# Patient Record
Sex: Female | Born: 1965 | Race: White | Hispanic: No | Marital: Single | Smoking: Never smoker
Health system: Southern US, Community
[De-identification: ages and names within clinical notes are randomized; demographics above are authoritative.]

## PROBLEM LIST (undated history)

## (undated) ENCOUNTER — Ambulatory Visit: Admission: EM | Payer: Commercial Managed Care - HMO

## (undated) DIAGNOSIS — R112 Nausea with vomiting, unspecified: Secondary | ICD-10-CM

## (undated) DIAGNOSIS — M549 Dorsalgia, unspecified: Secondary | ICD-10-CM

## (undated) DIAGNOSIS — G43909 Migraine, unspecified, not intractable, without status migrainosus: Secondary | ICD-10-CM

## (undated) DIAGNOSIS — I341 Nonrheumatic mitral (valve) prolapse: Secondary | ICD-10-CM

## (undated) DIAGNOSIS — F329 Major depressive disorder, single episode, unspecified: Secondary | ICD-10-CM

## (undated) DIAGNOSIS — K219 Gastro-esophageal reflux disease without esophagitis: Secondary | ICD-10-CM

## (undated) DIAGNOSIS — M26609 Unspecified temporomandibular joint disorder, unspecified side: Secondary | ICD-10-CM

## (undated) DIAGNOSIS — R011 Cardiac murmur, unspecified: Secondary | ICD-10-CM

## (undated) DIAGNOSIS — G4730 Sleep apnea, unspecified: Secondary | ICD-10-CM

## (undated) DIAGNOSIS — B009 Herpesviral infection, unspecified: Secondary | ICD-10-CM

## (undated) DIAGNOSIS — F32A Depression, unspecified: Secondary | ICD-10-CM

## (undated) DIAGNOSIS — T7840XA Allergy, unspecified, initial encounter: Secondary | ICD-10-CM

## (undated) DIAGNOSIS — I10 Essential (primary) hypertension: Secondary | ICD-10-CM

## (undated) DIAGNOSIS — M1990 Unspecified osteoarthritis, unspecified site: Secondary | ICD-10-CM

## (undated) DIAGNOSIS — Z9889 Other specified postprocedural states: Secondary | ICD-10-CM

## (undated) HISTORY — PX: SPINE SURGERY: SHX786

## (undated) HISTORY — DX: Unspecified osteoarthritis, unspecified site: M19.90

## (undated) HISTORY — DX: Migraine, unspecified, not intractable, without status migrainosus: G43.909

## (undated) HISTORY — DX: Herpesviral infection, unspecified: B00.9

## (undated) HISTORY — DX: Major depressive disorder, single episode, unspecified: F32.9

## (undated) HISTORY — PX: ABDOMINAL HYSTERECTOMY: SHX81

## (undated) HISTORY — DX: Essential (primary) hypertension: I10

## (undated) HISTORY — DX: Depression, unspecified: F32.A

## (undated) HISTORY — DX: Allergy, unspecified, initial encounter: T78.40XA

## (undated) HISTORY — PX: SHOULDER SURGERY: SHX246

## (undated) HISTORY — PX: CYSTECTOMY: SUR359

## (undated) HISTORY — DX: Nonrheumatic mitral (valve) prolapse: I34.1

## (undated) HISTORY — PX: JOINT REPLACEMENT: SHX530

## (undated) HISTORY — DX: Cardiac murmur, unspecified: R01.1

## (undated) HISTORY — PX: TMJ ARTHROPLASTY: SHX1066

---

## 1998-03-20 ENCOUNTER — Other Ambulatory Visit: Admission: RE | Admit: 1998-03-20 | Discharge: 1998-03-20 | Payer: Self-pay | Admitting: *Deleted

## 1998-06-12 ENCOUNTER — Inpatient Hospital Stay (HOSPITAL_COMMUNITY): Admission: RE | Admit: 1998-06-12 | Discharge: 1998-06-18 | Payer: Self-pay | Admitting: Neurosurgery

## 1998-07-14 ENCOUNTER — Ambulatory Visit (HOSPITAL_COMMUNITY): Admission: RE | Admit: 1998-07-14 | Discharge: 1998-07-14 | Payer: Self-pay | Admitting: Neurosurgery

## 1999-07-09 ENCOUNTER — Other Ambulatory Visit: Admission: RE | Admit: 1999-07-09 | Discharge: 1999-07-09 | Payer: Self-pay | Admitting: *Deleted

## 2000-04-27 ENCOUNTER — Encounter (INDEPENDENT_AMBULATORY_CARE_PROVIDER_SITE_OTHER): Payer: Self-pay | Admitting: *Deleted

## 2000-04-27 ENCOUNTER — Ambulatory Visit (HOSPITAL_COMMUNITY): Admission: RE | Admit: 2000-04-27 | Discharge: 2000-04-27 | Payer: Self-pay | Admitting: Gastroenterology

## 2000-05-13 ENCOUNTER — Other Ambulatory Visit: Admission: RE | Admit: 2000-05-13 | Discharge: 2000-05-13 | Payer: Self-pay | Admitting: *Deleted

## 2000-05-13 ENCOUNTER — Encounter (INDEPENDENT_AMBULATORY_CARE_PROVIDER_SITE_OTHER): Payer: Self-pay

## 2000-08-02 ENCOUNTER — Encounter (INDEPENDENT_AMBULATORY_CARE_PROVIDER_SITE_OTHER): Payer: Self-pay | Admitting: Specialist

## 2000-08-02 ENCOUNTER — Other Ambulatory Visit: Admission: RE | Admit: 2000-08-02 | Discharge: 2000-08-02 | Payer: Self-pay | Admitting: *Deleted

## 2001-06-07 ENCOUNTER — Other Ambulatory Visit: Admission: RE | Admit: 2001-06-07 | Discharge: 2001-06-07 | Payer: Self-pay | Admitting: *Deleted

## 2003-01-22 ENCOUNTER — Other Ambulatory Visit: Admission: RE | Admit: 2003-01-22 | Discharge: 2003-01-22 | Payer: Self-pay | Admitting: *Deleted

## 2004-07-07 ENCOUNTER — Emergency Department (HOSPITAL_COMMUNITY): Admission: EM | Admit: 2004-07-07 | Discharge: 2004-07-07 | Payer: Self-pay | Admitting: Emergency Medicine

## 2005-05-03 ENCOUNTER — Ambulatory Visit (HOSPITAL_COMMUNITY): Admission: RE | Admit: 2005-05-03 | Discharge: 2005-05-03 | Payer: Self-pay | Admitting: Gastroenterology

## 2005-09-16 ENCOUNTER — Encounter: Admission: RE | Admit: 2005-09-16 | Discharge: 2005-09-16 | Payer: Self-pay | Admitting: Gastroenterology

## 2005-09-28 ENCOUNTER — Other Ambulatory Visit: Admission: RE | Admit: 2005-09-28 | Discharge: 2005-09-28 | Payer: Self-pay | Admitting: *Deleted

## 2007-02-13 ENCOUNTER — Other Ambulatory Visit: Admission: RE | Admit: 2007-02-13 | Discharge: 2007-02-13 | Payer: Self-pay | Admitting: *Deleted

## 2007-06-18 ENCOUNTER — Encounter: Admission: RE | Admit: 2007-06-18 | Discharge: 2007-06-18 | Payer: Self-pay | Admitting: Orthopedic Surgery

## 2007-07-19 ENCOUNTER — Inpatient Hospital Stay (HOSPITAL_COMMUNITY): Admission: RE | Admit: 2007-07-19 | Discharge: 2007-07-24 | Payer: Self-pay | Admitting: Orthopedic Surgery

## 2007-10-23 ENCOUNTER — Inpatient Hospital Stay (HOSPITAL_COMMUNITY): Admission: RE | Admit: 2007-10-23 | Discharge: 2007-10-27 | Payer: Self-pay | Admitting: Orthopedic Surgery

## 2008-02-05 ENCOUNTER — Emergency Department (HOSPITAL_COMMUNITY): Admission: EM | Admit: 2008-02-05 | Discharge: 2008-02-06 | Payer: Self-pay | Admitting: Emergency Medicine

## 2008-03-28 ENCOUNTER — Emergency Department (HOSPITAL_COMMUNITY): Admission: EM | Admit: 2008-03-28 | Discharge: 2008-03-28 | Payer: Self-pay | Admitting: Emergency Medicine

## 2008-07-03 ENCOUNTER — Emergency Department (HOSPITAL_COMMUNITY): Admission: EM | Admit: 2008-07-03 | Discharge: 2008-07-04 | Payer: Self-pay | Admitting: Emergency Medicine

## 2009-07-08 ENCOUNTER — Ambulatory Visit (HOSPITAL_COMMUNITY): Admission: RE | Admit: 2009-07-08 | Discharge: 2009-07-08 | Payer: Self-pay | Admitting: Internal Medicine

## 2009-08-31 ENCOUNTER — Emergency Department (HOSPITAL_COMMUNITY): Admission: EM | Admit: 2009-08-31 | Discharge: 2009-08-31 | Payer: Self-pay | Admitting: Emergency Medicine

## 2009-09-02 ENCOUNTER — Emergency Department (HOSPITAL_COMMUNITY): Admission: EM | Admit: 2009-09-02 | Discharge: 2009-09-02 | Payer: Self-pay | Admitting: Emergency Medicine

## 2010-09-04 ENCOUNTER — Ambulatory Visit (HOSPITAL_COMMUNITY): Admission: RE | Admit: 2010-09-04 | Discharge: 2010-09-04 | Payer: Self-pay | Admitting: Internal Medicine

## 2010-10-10 ENCOUNTER — Emergency Department (HOSPITAL_COMMUNITY): Admission: EM | Admit: 2010-10-10 | Discharge: 2010-10-10 | Payer: Self-pay | Admitting: Emergency Medicine

## 2011-01-03 ENCOUNTER — Encounter: Payer: Self-pay | Admitting: Internal Medicine

## 2011-04-03 ENCOUNTER — Inpatient Hospital Stay (HOSPITAL_COMMUNITY)
Admission: AD | Admit: 2011-04-03 | Discharge: 2011-04-04 | Disposition: A | Discharge status: Home / Self Care | Payer: Self-pay | Source: Ambulatory Visit | Attending: Obstetrics & Gynecology | Admitting: Obstetrics & Gynecology

## 2011-04-03 DIAGNOSIS — N760 Acute vaginitis: Secondary | ICD-10-CM | POA: Insufficient documentation

## 2011-04-03 DIAGNOSIS — N949 Unspecified condition associated with female genital organs and menstrual cycle: Secondary | ICD-10-CM

## 2011-04-03 DIAGNOSIS — A499 Bacterial infection, unspecified: Secondary | ICD-10-CM | POA: Insufficient documentation

## 2011-04-03 DIAGNOSIS — B9689 Other specified bacterial agents as the cause of diseases classified elsewhere: Secondary | ICD-10-CM | POA: Insufficient documentation

## 2011-04-03 LAB — URINALYSIS, ROUTINE W REFLEX MICROSCOPIC
Bilirubin Urine: NEGATIVE
Glucose, UA: NEGATIVE mg/dL
Hgb urine dipstick: NEGATIVE
Ketones, ur: NEGATIVE mg/dL
Nitrite: NEGATIVE
Protein, ur: NEGATIVE mg/dL
Specific Gravity, Urine: 1.025 (ref 1.005–1.030)
Urobilinogen, UA: 0.2 mg/dL (ref 0.0–1.0)
pH: 6 (ref 5.0–8.0)

## 2011-04-04 LAB — WET PREP, GENITAL
Trich, Wet Prep: NONE SEEN
Yeast Wet Prep HPF POC: NONE SEEN

## 2011-04-07 ENCOUNTER — Inpatient Hospital Stay (HOSPITAL_COMMUNITY)
Admission: AD | Admit: 2011-04-07 | Discharge: 2011-04-07 | Disposition: A | Discharge status: Home / Self Care | Payer: Self-pay | Source: Ambulatory Visit | Attending: Obstetrics & Gynecology | Admitting: Obstetrics & Gynecology

## 2011-04-07 DIAGNOSIS — N949 Unspecified condition associated with female genital organs and menstrual cycle: Secondary | ICD-10-CM

## 2011-04-07 DIAGNOSIS — B373 Candidiasis of vulva and vagina: Secondary | ICD-10-CM

## 2011-04-07 DIAGNOSIS — B3731 Acute candidiasis of vulva and vagina: Secondary | ICD-10-CM | POA: Insufficient documentation

## 2011-04-09 LAB — HERPES SIMPLEX VIRUS CULTURE: Culture: DETECTED

## 2011-04-27 NOTE — Op Note (Signed)
NAME:  Carolyn Allen, Carolyn Allen            ACCOUNT NO.:  [id number]   MEDICAL RECORD NO.:  [id number]          PATIENT TYPE:  INP   LOCATION:  2550                         FACILITY:  [facility]   PHYSICIAN:  Feliberto Gottron. Turner Daniels, M.D.   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   PROCEDURE:  Left total knee arthroplasty using DePuy Sigma RP  components, #4 femur, 4 tibia, 10-mm Sigma RP spacer, 38-mm patellar  button, double batch of DePuy HV cement with 1500 mg Zinacef.  All  components cemented.   SURGEON:  Feliberto Gottron. Turner Daniels, M.D.   FIRST ASSISTANT:  Skip Mayer, PA-C.   ANESTHESIA:  General endotracheal.   ESTIMATED BLOOD LOSS:  Minimal.   FLUID REPLACEMENT:  1500 mL crystalloid.   DRAINS PLACED:  Foley catheter.  Two medium Hemovacs.   URINE OUTPUT:  300 mL.   TOURNIQUET TIME:  1 hour and 30 minutes.   INDICATIONS FOR PROCEDURE:  The patient is a 45 year old woman who had  bilateral distal femur AVN of the knees with severe unremitting pain.  She  underwent a successful right total hip arthroplasty back in June  and desires same on the left.  The risks and benefits of surgery are  well-known to the patient, and she is prepared for surgical intervention  on her left knee.   DESCRIPTION OF PROCEDURE:  The patient identified by armband and taken  to the operating room at [facility] after femoral nerve block  anesthesia was induced in the block area.  Appropriate anesthetic  monitors were attached and general endotracheal anesthesia induced.  Tourniquet applied high to the left thigh.  Foley catheter was  inserted.  She received a gram of Ancef preoperatively.  Lateral post and foot  positioner applied to the table and the left lower extremity prepped and  draped in the usual sterile fashion from the ankle to the tourniquet.  Limb wrapped with an Esmarch bandage, tourniquet inflated to 350 mmHg.   We began the procedure by making the anterior midline incision 18 cm in  length starting one handbreadth above the patella going over the patella  and 1 cm medial to and 3 cm distal to the tibial tubercle, cutting  through the skin and subcutaneous tissue.  Small bleeders were  identified and cauterized.  The retinaculum over the patella was incised  and reflected medially, allowing medial parapatellar arthrotomy.  The  patella was everted.  Prepatellar fat pad was resected. Superficial  medial collateral ligament was elevated from anterior to posterior off  the proximal tibia and left intact distally, peeling it off down about 4-  5 cm down the metaphyseal flare of the tibia.  The patella was everted.  The knee was hyperflexed. The cruciate ligaments were  resected with the  electrocautery as were the anterior one-half of menisci.  A  posteromedial Z retractor was placed, a McHale through the notch, and a  lateral Homan retractor.  This exposed the proximal tibia which was  entered with the DePuy step drill followed by an intramedullary rod, and  a 0-degree proximal tibial cutting guide was pinned into place allowing  removal of 8-9 mm of bone medially and laterally using the power saw and  protecting the posterior structures with the retractors.  We then  entered the distal femur 2 mm anterior to the PCL origin followed by the  IM rod and the 5 degree left distal femoral cutting guide set for 11 mm.  It was pinned in place along the epicondylar axis, and the distal 11 mm  of the femur were then resected.  We sized for #4 femoral cutting jig  which was pinned in place in neutral rotation,  allowing anterior,  posterior, and chamfer cuts to be made followed by the box cut.  The  patella was measured out at 22 mm.  The cutting guide was set at 14, and  the posterior 8-9 mm of the patella resected and sized for a 38-mm  patellar button and the lugs were drilled.  At this point, the knee was  once again hyperflexed.  The proximal tibia was exposed.  We sized for a  #4 tibial baseplate which was pinned into place followed by the conical  reamer and the Delta fit keel punch with the bullet tip.  The #4 left  trial femoral component was then hammered on the femur.  The lugs were  drilled.  A 10-mm spacer trial was placed on top of the tibial trial  baseplate, and the knee was reduced with the patellar trial.  The knee  came to full extension, flexed to 130 degrees.  The ligaments were  stable, and the trial components were then removed.  All bony surfaces  were water picked clean and dried with suction and sponges.  A double  batch of DePuy HV cement with 1500 mg Zinacef was mixed at the back  table and applied to all bony and metallic mating surfaces, except for  the posterior condyles of the femur itself.  In order, we hammered into  place a #4 tibial baseplate, a 4 left femoral component, a 10-mm Sigma  RP spacer, and a 38-mm patellar button was squeezed into place.  Excess  cement was removed.  The knee was held in full extension as the cement  cured.  The wound was once again water picked clean.  A medium Hemovac  drain was placed deep in the wound.  The parapatellar arthrotomy was  closed with running #1 Vicryl suture, the subcutaneous tissue with 0 and  2-0 undyed Vicryl suture, and the skin with skin staples.  A dressing of  Xeroform, 4 x 4 dressings, sponges, Webril, and Ace wrap applied.  Tourniquet let down.   The patient awakened and taken to the recovery room without difficulty.      Feliberto Gottron. Turner Daniels, M.D.  Electronically Signed     FJR/MEDQ  D:  10/23/2007  T:   10/23/2007  Job:  [id number]

## 2011-04-27 NOTE — Op Note (Signed)
NAME:  Carolyn Allen, Carolyn Allen            ACCOUNT NO.:  [id number]   MEDICAL RECORD NO.:  [id number]          PATIENT TYPE:  INP   LOCATION:  5040                         FACILITY:  [facility]   PHYSICIAN:  Feliberto Gottron. Turner Daniels, M.D.   DATE OF BIRTH:  [birth year]   DATE OF PROCEDURE:  07/19/2007  DATE OF DISCHARGE:                               OPERATIVE REPORT   PREOPERATIVE DIAGNOSIS:  Avascular necrosis of the right distal femur  and proximal tibia.   POSTOPERATIVE DIAGNOSIS:  Avascular necrosis of the right distal femur  and proximal tibia.   PROCEDURE:  Right total knee arthroplasty using DePuy Sigma RP  components, all cemented, 3 right femur, 4 tibial baseplate, 10-mm Sigma  RP bearing and a 35-mm patellar button.   SURGEON:  Feliberto Gottron.  Turner Daniels, MD   FIRST ASSISTANT:  Skip Mayer, PA-C.   ANESTHETIC:  General endotracheal.   ESTIMATED BLOOD LOSS:  Minimal.   FLUID REPLACEMENT:  1500 mL of crystalloid.   DRAINS PLACED:  Foley catheter and 2 medium Hemovac.   TOURNIQUET TIME:  One hour and 40 minutes.   URINE OUTPUT:  300 mL.   INDICATIONS FOR PROCEDURE:  [age]-year-old woman with MRI-proven  avascular necrosis of the lateral and medial femoral condyles as well as  the proximal tibia of both knees.  She has severe unremitting pain and  has failed conservative treatment and desires elective right total knee  arthroplasty.  Risks and benefits of surgery were discussed with the  patient.   DESCRIPTION OF PROCEDURE:  The patient was identified by armband and  taken the operating room at [facility], where the appropriate  anesthetic monitors were attached and general endotracheal anesthesia  induced with the patient in a supine position.  She received a gram of  Ancef preoperatively.  A Foley catheter was inserted and a tourniquet  applied high to the right thigh.  A foot positioner and lateral post  were applied to the table and the right lower extremity prepped and  draped in  the usual sterile fashion from the ankle to the midthigh.  The  limb was wrapped with an Esmarch bandage and tourniquet inflated to 350  mmHg.  We began the procedure by making an anterior midline incision  starting one handbreadth above the patella, going over the patella and  distal to the patella to 1 cm below the tibial tubercle and about a  centimeter medial to the tibial tubercle.  Small bleeders in the skin  and subcutaneous tissue were identified and cauterized.  The transverse  retinaculum was incised in line with the skin incision and reflected  medially, allowing a medial parapatellar arthrotomy.  The patella was  everted and the prepatellar fat pad resected.  Superficial medial  collateral ligament was then elevated off the medial tibial plateau,  going from anterior to posterior and leaving it attached distally to  loosen the medial collateral ligament and allow external rotation of the  tibia.  The knee was then placed in a hyperflexed position.  The  anterior one-half of both menisci were resected.  The cruciate ligaments  were  resected, as well as some small notch osteophytes.  A posterior  medial Z retractor was then placed on the tibia, a McHale retractor  through the notch and a lateral Homan retractor.  The proximal tibia was  then entered with a DePuy step drill followed by an intramedullary rod  and a 0-degree posterior slope cutting guide, allowing resection of 8-9  mm medially and laterally from the proximal tibia after the cutting  guide had been pinned into place and the rod removed.  The distal femur  was entered with the intramedullary guide set for 5 degrees right.  The  cutting guide set to 11 mm was pinned into place along the epicondylar  axis and the distal 11 mm of the femur were resected with a power  oscillating saw.  We sized for a #3 right femoral component.  The  cutting guide was pinned into place in neutral rotation.  Anterior,  posterior and  chamfer cuts were accomplished without difficulty,  followed by the DePuy box cut.  The everted patella was then measured at  24 mm and a cutting guide set at 15 mm and the posterior aspect of the  patella resected and sized for a 35 patellar button and drilled.  We  then hyperflexed the knee once again and sized the tibia to a #4 tibial  baseplate; this was pinned into place.  The smokestack was then hammered  onto the plate and the conical reaming accomplished, followed by the  Delta fit keel cut with the bullet tip on the trial.  We then placed a  trial 3 right femur.  A 10-mm Sigma RP trial spacer was then placed and  the knee reduced and taken through a range of motion, noted to come to  full extension and flexed to 130 degrees.  The trial patellar button was  noted to track without difficulty.  Trial components were then removed.  All bony surfaces were water-picked clean, dried with suction and  sponges and a double batch of DePuy HV cement was mixed with 1500 mg of  Zinacef and applied to all bony metallic mating surfaces, except for the  posterior condyles of the femur.  In order, we hammered into place a #4  tibial baseplate and removed excess cement, a 3 right femoral component  and removed excess cement and the 35-mm patellar button and removed  excess cement and was squeezed into place.  After the cement had cured,  the knee was once again reduced with a 10-mm Sigma RP spacer and noted  to go from 0-130 degrees of flexion without difficulty.  Collateral  ligaments were noted to be stable.  Medium Hemovac drains were then  placed in the wound.  The wound was once again water-picked clean,  parapatellar arthrotomy closed with running #1 Vicryl suture, the  subcutaneous tissue with 0 and 2-0 undyed Vicryl suture and the skin  with skin staples.  A dressing of Xeroform, 4 x 4 dressing sponges,  Webril and Ace wrap were applied.  Tourniquet was let down.  The patient  was awakened  and taken to the recovery room without difficulty.      Feliberto Gottron. Turner Daniels, M.D.  Electronically Signed     FJR/MEDQ  D:  07/19/2007  T:  07/20/2007  Job:  [id number]

## 2011-04-30 NOTE — Discharge Summary (Signed)
NAME:  LUVERNA, DEGENHART            ACCOUNT NO.:  [id number]   MEDICAL RECORD NO.:  [id number]          PATIENT TYPE:  INP   LOCATION:  5016                         FACILITY:  [facility]   PHYSICIAN:  Feliberto Gottron. Turner Daniels, M.D.   DATE OF BIRTH:  Aug 06, 1966   DATE OF ADMISSION:  10/23/2007  DATE OF DISCHARGE:  10/27/2007                               DISCHARGE SUMMARY   PRIMARY DIAGNOSIS:  End-stage degenerative joint disease of the left  knee.   PROCEDURE:  Left total knee arthroplasty.   HISTORY OF PRESENT ILLNESS:  The patient is a 45 year old woman who had  bilateral distal femur AVn  and underwent successful right total knee  arthroplasty back in June and desires the same on the left.  The risks  and benefits of surgery were explained to the patient and she was  prepped for the surgery.   ALLERGIES:  CODEINE.   PAST MEDICAL HISTORY:  1. Mitral valve prolapse.  2. Degenerative joint disease.  3. Migraines.  4. Temporomandibular joint.   PAST SURGICAL HISTORY:  1. Back fusion.  2. Hysterectomy.  3. Shoulder.  4. __________ .   SOCIAL HISTORY:  No tobacco, ethanol, or IV drug abuse.  She is  divorced.  She is a [employer] doctor.   FAMILY HISTORY:  Her father died at age 18 with a history of cancer and  myocardial infarction.  Her mother died at age 11 of cancer.   REVIEW OF SYSTEMS:  Positive for glasses, jaw pain, shortness of breath  with exertion, hemorrhoids.   PHYSICAL EXAMINATION:  VITAL SIGNS:  Temperature 98, pulse 71,  respirations 16, blood pressure 117/87.  The patient is 5 feet, 7 inches  and weighs 95 pounds.  HEENT:  She has poor dentition of most teeth causing her jaw pain.  HEART:  Has a slight murmur.  EXTREMITIES:  Otherwise normal examination except for her knees which  showed bilateral range of motion at 0-110 degrees with pain, although  the right knee is no longer painful.   PREOPERATIVE LABORATORIES:  Revealed a CBC, comprehensive metabolic  panel, chest  x-ray, PT, and PTT were within normal limits.   HOSPITAL COURSE:  The patient was taken to the [facility] on the day  of surgery where she underwent a left total knee arthroplasty using a  DePuy single RP components, #4 femur, #4 tibia, 10 mm cemented RP  spacer, 38 mm patellar button, DePuy HV cement with 1,500 mg of Omnicef  embedded.  A medium Hemovac was placed to drain.  A Foley was placed  preoperatively.  Postoperatively, the patient was placed on a Dilaudid  patient-controlled analgesic pump for pain control and postoperative  antibiotics.   On postoperative day 1, the patient was awake, alert, in pain 8/10  controlled with the patient-controlled analgesic.  Her wound was clean  and dry.  She was otherwise stable.  Hemoglobin 11.5.  PT 12.7.  She  continued weightbearing as tolerated with physical therapy.  The patient-  controlled analgesic pump was discharged.   On postoperative day 2, the patient continued to make steady progress.  Hemoglobin  9.3.  Vital signs were stable.  INR 1.5.  The dressing was  dry.  Incentive spirometer was encouraged because of some shortness of  breath.  Otherwise, she remained stable.  Physical therapy was  continued.   On postoperative day 3, the patient continued to make slow, but steady  progress without complaints.  She was afebrile.  She had not yet met her  physical therapy goals.   On postoperative day 4, the patient was without complaints.  She was  afebrile.  Her wound was clean and dry.  She was discharged home in the  care of her family.  She will have home physical therapy, home  registered nurse, and Coumadin per [facility].  She will return  to the clinic with Dr. Turner Daniels in 1 weeks time, call in for an  appointment.  Weightbearing as tolerated.  Activity with a walker.  Diet  is regular.  Dressing changes as needed.   MEDICATIONS:  Home medical record sheet is assigned and she also will be  given a prescription for  Coumadin and Tylox.      Laural Benes. [name].      Feliberto Gottron. Turner Daniels, M.D.  Electronically Signed    JBR/MEDQ  D:  01/04/2008  T:  01/05/2008  Job:  [id number]

## 2011-04-30 NOTE — Op Note (Signed)
NAME:  Carolyn Allen, Carolyn Allen            ACCOUNT NO.:  [id number]   MEDICAL RECORD NO.:  [id number]          PATIENT TYPE:  AMB   LOCATION:  ENDO                         FACILITY:  [facility]   PHYSICIAN:  Anselmo Rod, M.D.  DATE OF BIRTH:  [birth year]   DATE OF PROCEDURE:  DATE OF DISCHARGE:                                 OPERATIVE REPORT   PROCEDURE PERFORMED:  Screening colonoscopy.   ENDOSCOPIST:  Charna Elizabeth, M.D.   INSTRUMENT USED:  Olympus video colonoscope.   INDICATIONS FOR PROCEDURE:  A 45 year old white female with a family history  of colon cancer in her mother. Undergoing screening colonoscopy to rule out  colonic polyps, masses, etc.   PREPROCEDURE PREPARATION:  Informed consent was procured from the patient.  The patient was fasted for eight hours prior to the procedure and prepped  with a bottle of magnesium citrate and a gallon of GoLYTELY the night prior  to the procedure.  The risks and benefits of the procedure including a 10%  miss rate for colon polyps or cancers was discussed with the patient as  well.   PREPROCEDURE PHYSICAL:  The patient had stable vital signs.  Neck supple.  Chest clear to auscultation.  S1 and S2 regular.  Abdomen soft with normal  bowel sounds.   DESCRIPTION OF PROCEDURE:  The patient was placed in left lateral decubitus  position and sedated with 100 mg of Demerol and 10 mg of Versed in slow  incremental doses.  Once the patient was adequately sedated and maintained  on low flow oxygen and continuous cardiac monitoring, the Olympus video  colonoscope was advanced from the rectum to the cecum.  The appendicular  orifice and ileocecal valve were visualized and photographed.  The terminal  ileum appeared healthy and without lesion.  Retroflexion in the rectum  revealed small internal hemorrhoids. There was some residual stool in the  colon and multiple washes were done.  Small lesions could have been missed.  The patient tolerated the  procedure well without complication.   IMPRESSION:  1. Normal colonoscopy to the terminal ileum except for small internal      hemorrhoids, no evidence of masses, polyps of diverticulosis.  2. Some residual stool in the colon.  Small lesions could be missed.     RECOMMENDATIONS:  1. Continue high fiber diet with liberal fluid intake.  2. Repeat colonoscopy in the next five years unless the patient develops      any abnormal symptoms in the interim.  3. Outpatient followup as need arises in the future.        JNM/MEDQ  D:  05/03/2005  T:  05/03/2005  Job:  [id number]   cc:   Harrel Lemon. Merla Riches, M.D.  [address]  [place]  [place] [address]  Fax: [phone]   Almedia Balls. Fore, M.D.  [phone] [address] [place]  [place] [address]  Fax: [phone]

## 2011-04-30 NOTE — Discharge Summary (Signed)
NAME:  Carolyn Allen, Carolyn Allen            ACCOUNT NO.:  [id number]   MEDICAL RECORD NO.:  [id number]          PATIENT TYPE:  INP   LOCATION:  5040                         FACILITY:  [facility]   PHYSICIAN:  Feliberto Gottron. Turner Daniels, M.D.   DATE OF BIRTH:  Mar 06, 1966   DATE OF ADMISSION:  07/19/2007  DATE OF DISCHARGE:  07/24/2007                               DISCHARGE SUMMARY   DIAGNOSIS:  Right knee end-stage degenerative joint disease.   PROCEDURE WHILE IN HOSPITAL:  Right total knee arthroplasty.   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  She has severe unrelenting pain and has  failed conservative treatment.  She desires elective right total knee  arthroplasty.  The risks and benefits of the surgery were discussed with  the patient and she wished to proceed with the surgical intervention.   ALLERGIES:  CODEINE.   MEDICATIONS AT TIME OF ADMISSION:  1. Darvocet.  2. Alprazolam.  3. Belladonna Phenobarbital.  4. Paroxetine.  5. Flexeril.  6. Meloxicam.  7. Lisinopril.  8. Vitamin C.  9. Calcium.  10.Multivitamin.  11.Phenergan.  12.Imitrex.  13.Stool softener as needed.   PAST MEDICAL HISTORY:  Usual childhood diseases.   ADULT HISTORY:  1. Mitral valve prolapse.  2. DJD.  3. Migraines.  4. TMJ.   PAST SURGICAL HISTORY:  1. Knee scopes.  2. Back cages.  3. Hysterectomy.  4. Shoulder surgery.   SOCIAL HISTORY:  No tobacco.  No ethanol.  No IV drug abuse.  She is  divorced.  She works as a Midwife.   FAMILY HISTORY:  Father died at age 85 with a history of cancer and MI.  The mother died at age 76 due to cancer.   REVIEW OF SYSTEMS:  HEENT:  Positive for glasses, jaw pain secondary to  her TMJ.  LUNGS:  Shortness of breath with exertion.  Hemorids.  No  recent illness or chest pain.   PHYSICAL EXAMINATION:  VITAL SIGNS:  Temperature 98.4,  pulse 71,  respirations 16, blood pressure 117/87.  She is a 5 feet 7 inches, 195-  pound female.  HEAD:  Normocephalic, atraumatic.  EARS:  TMs are clear.  EYES:  Pupils are equal and reactive to light and accommodation.  MOUTH:  Significant for poor dentition.  No loose teeth.  NOSE:  Clear.  THROAT:  Benign.  NECK:  Full range of motion but positive jaw pain.  CHEST:  Clear to auscultation and percussion.  HEART:  Slight murmur, otherwise regular rate and rhythm.  ABDOMEN:  Soft, nontender.  EXTREMITIES:  Bilateral knee range of motion 0 to 110 degrees with  positive pain, positive foot tap with pain at the knee, stable  ligamentously.  SKIN:  Intact with no abnormalities noted.   X-RAYS:  MRI of both knees shows a femoral AVN, intraoperative picture  is  positive for grade 4 condyle malacia change as well.   PREOPERATIVE LABS:  Include CBC, CMP, chest x-ray, PT, PCT are all  within normal limits with the exception of an albumin of 2.9 and an AST  of 46.   HOSPITAL COURSE:  On the day of admission, the patient was taken to the  operating room at [facility] where she underwent right total  knee arthroplasty using Sigma DePuy RP components, all cemented, #3  right femur, #4 tibia.  A 10-mm Sigma RP bearing and a 35-mm patellar  button, a cemented medium Hemovac was placed alarming to the knee.  Foley catheter was placed perioperatively.  She was placed on  postoperative Coumadin prophylaxis per pharmacy protocol as well as  bridging Lovenox therapy.  She was placed on a PCA Dilaudid pain pump  for her pain control.  Physical therapy was begun in the PACU using a  CPM.  On postoperative day #1, the patient was awake and alert, pain  5/10.  Urine output had increased after a bolus.  Vital signs were  stable.  Blood pressure at 106/73.  PT 14.3.  Hemoglobin 10.0.  WBC  17.1.  She was afebrile and was clean and dry tolerating CPM well.  The  drain was discontinued as it  had become quiescent.  She was otherwise  stable and had been volume depleted but had an increase after bolus.  She was otherwise improving steadily while on her orthopedic pathway.   On postoperative day #2, the patient was complaining of moderate pain.  She was afebrile.  Hemoglobin 9.2.  WBC 12.8.  INR 1.5.  The dressing  was dry.  She was otherwise orthopedically stable.  PCA was  discontinued.  The Foley was discontinued.   On postoperative day #3, the patient was continuing to improve.  She is  afebrile.  Hemoglobin 9.4.  WBC 13.4.  INR 1.9.  The incision was clean  and dry.  Otherwise, neurovascularly intact.  No signs or symptoms  significant for post blood loss anemia.  Discharge planning for the next  day was continued.   On postoperative day #4, the patient was improving, still had not met  her physical therapy goals.  Afebrile.  Chest:  Clear to auscultation.  The wound was clean and dry.  INR was 2.3., otherwise stable and hopeful  home the following day.   On postoperative day #5, the patient was awake and alert, moderate pain,  walking in the hallway.  She could actually bend her knee to almost 90  degrees.  Vital signs were stable.  INR of 2.3.  Urine output is steady.  She was otherwise medically stable and orthopedically improved and was  discharged home in the care of her family.   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   PROCEDURE:  Right total knee arthroplasty.   DISCHARGE INSTRUCTIONS:  Return to clinic in approximately one week's  time to  Dr. Turner Daniels, or sooner, if should she have any increase in pain,  drainage from the wound or fever over 101.  Prescriptions given for  Percocet and Coumadin.  She will continue with her preoperative  medications as per her home med direction sheet.  Activity:  Weightbearing as tolerated with walker.  Wound care:  Dressing changes  daily or as necessary.  Diet:  Regular.      Laural Benes.  [name].      Feliberto Gottron. Turner Daniels, M.D.  Electronically Signed    JBR/MEDQ  D:  09/05/2007  T:  09/05/2007  Job:  [id number]

## 2011-04-30 NOTE — Procedures (Signed)
[place]. [facility]  Patient:    Carolyn Allen, Carolyn Allen                    MRN: [id number] Proc. Date: 04/27/00 Adm. Date:  [id number] Disc. Date: [id number] Attending:  Charna Elizabeth CC:         Harrel Lemon. Merla Riches, M.D.                           Procedure Report  DATE OF BIRTH:  January 03, 1956  REFERRING PHYSICIAN:  Harrel Lemon. Merla Riches, M.D.  PROCEDURE PERFORMED:  Esophagogastroduodenoscopy.  ENDOSCOPIST:  Anselmo Rod, M.D.  INSTRUMENT USED:  Olympus video panendoscope.  INDICATIONS FOR PROCEDURE:  Epigastric pain with blood in stool in a 45 year old white female rule out peptic ulcer disease, esophagitis, gastritis, etc.  PREPROCEDURE PREPARATION:  Informed consent was procured from the patient. The patient was fasted for eight hours prior to the procedure.  The patient was given Unasyn as preprocedure prophylaxis considering her history of mitral valve prolapse.  PREPROCEDURE PHYSICAL:  The patient had stable vital signs.  Neck supple. Chest clear to auscultation.  S1, S2 regular.  No murmurs, gallops or rubs, no rales, rhonchi or wheezing.  Abdomen soft with normal abdominal bowel sounds.  DESCRIPTION OF PROCEDURE:  The patient was placed in left lateral decubitus position and sedated with 70 mg of Demerol and 7 mg of Versed intravenously. Once the patient was adequately sedated and maintained on low-flow oxygen and continuous cardiac monitoring, the Olympus video panendoscope was advanced through the mouthpiece, over the tongue, into the esophagus under direct vision.  The entire esophagus appeared normal without evidence of ring strictrure, masses, lesions or esophagitis.  The scope was then advanced to the stomach.  The entire gastric mucosa appeared healthy and there was no hiatal hernia seen on high retroflexion.  The duodenal bulb and the small bowel distal to the bulb appeared normal.  There was no outlet obsruction. The patient  tolerated the procedure well without complications.  IMPRESSION:  Normal esophagogastroduodenoscopy.  RECOMMENDATION: 1. An abdominal ultrasound and a HIDA scan will be done to rule out    gallbladder pathology.  Further recommendations will be made thereafter. 2. Proceed with colonoscopy at this time. DD:  04/27/00 TD:  05/02/00 Job: [id number]

## 2011-04-30 NOTE — Procedures (Signed)
[place]. [facility]  Patient:    Carolyn Allen, Carolyn Allen                    MRN: [id number] Proc. Date: 04/27/00 Adm. Date:  [id number] Disc. Date: [id number] Attending:  Charna Elizabeth CC:         Harrel Lemon. Merla Riches, M.D.                           Procedure Report  DATE OF BIRTH:  [birth year]  REFERRING PHYSICIAN:  Harrel Lemon. Merla Riches, M.D.  PROCEDURE PERFORMED:  Colonoscopy with biopsies.  ENDOSCOPIST:  Anselmo Rod, M.D.  INSTRUMENT USED:  Olympus video colonoscope.  INDICATIONS FOR PROCEDURE:  Blood in stool in a 45 year old white female rule out polyps, hemorrhoids, masses, etc.  PREPROCEDURE PREPARATION:  Informed consent was procured from the patient. The patient was fasted for eight hours prior to the procedure and prepped with a bottle of magnesium citrate and a gallon of NuLytely the night prior to the procedure.  PREPROCEDURE PHYSICAL:  The patient had stable vital signs.  Neck supple. Chest clear to auscultation.  S1, S2 regular.  Abdomen soft with normal abdominal bowel sounds.  Epigastric tenderness on palpation.  DESCRIPTION OF PROCEDURE:  The patient was placed in the left lateral decubitus position and sedated with an additional 30 mg of Demerol and 3 mg of Versed intravenously.  Once the patient was adequately sedated and maintained on low-flow oxygen and continuous cardiac monitoring, the Olympus video colonoscope was advanced from the rectum to the cecum without difficulty.  The patient had a fairly good prep.  A submucosal lesion was seen in the cecum that was biopsied to rule out lipoma.  No ulcers, erosions, masses or polyps were present besides the one mentioned above.  Small internal hemorrhoids seen on retroflexion.  The rest of the rectum and the rest of the colonic mucosa appeared healthy without lesions.  There was no evidence of diverticular disease.  The patient tolerated the procedure well without  complications.  IMPRESSION: 1. Small nonbleeding internal hemorrhoids. 2. Submucosal mass in cecum, biopsied to rule out lipoma.  Results pending.  RECOMMENDATIONS: 1. Await pathology results. 2. Outpatient follow-up in the next two weeks.DD:  04/27/00 TD:  05/02/00 Job: [id number]

## 2011-05-16 ENCOUNTER — Emergency Department (HOSPITAL_COMMUNITY)
Admission: EM | Admit: 2011-05-16 | Discharge: 2011-05-17 | Disposition: A | Discharge status: Home / Self Care | Payer: Self-pay | Attending: Emergency Medicine | Admitting: Emergency Medicine

## 2011-05-16 DIAGNOSIS — Y92009 Unspecified place in unspecified non-institutional (private) residence as the place of occurrence of the external cause: Secondary | ICD-10-CM | POA: Insufficient documentation

## 2011-05-16 DIAGNOSIS — I10 Essential (primary) hypertension: Secondary | ICD-10-CM | POA: Insufficient documentation

## 2011-05-16 DIAGNOSIS — F3289 Other specified depressive episodes: Secondary | ICD-10-CM | POA: Insufficient documentation

## 2011-05-16 DIAGNOSIS — W278XXA Contact with other nonpowered hand tool, initial encounter: Secondary | ICD-10-CM | POA: Insufficient documentation

## 2011-05-16 DIAGNOSIS — S61209A Unspecified open wound of unspecified finger without damage to nail, initial encounter: Secondary | ICD-10-CM | POA: Insufficient documentation

## 2011-05-16 DIAGNOSIS — F329 Major depressive disorder, single episode, unspecified: Secondary | ICD-10-CM | POA: Insufficient documentation

## 2011-05-16 DIAGNOSIS — Z79899 Other long term (current) drug therapy: Secondary | ICD-10-CM | POA: Insufficient documentation

## 2011-05-21 ENCOUNTER — Inpatient Hospital Stay (INDEPENDENT_AMBULATORY_CARE_PROVIDER_SITE_OTHER)
Admission: RE | Admit: 2011-05-21 | Discharge: 2011-05-21 | Disposition: A | Discharge status: Home / Self Care | Payer: Self-pay | Source: Ambulatory Visit | Attending: Emergency Medicine | Admitting: Emergency Medicine

## 2011-05-21 DIAGNOSIS — S61209A Unspecified open wound of unspecified finger without damage to nail, initial encounter: Secondary | ICD-10-CM

## 2011-09-10 LAB — POCT CARDIAC MARKERS
CKMB, poc: 1.2
CKMB, poc: <1 — ABNORMAL LOW
Myoglobin, poc: 40
Myoglobin, poc: 44
Operator id: 133351
Operator id: 277751
Troponin i, poc: <0.05
Troponin i, poc: <0.05

## 2011-09-10 LAB — POCT I-STAT, CHEM 8
BUN: 9
Calcium, Ion: 1.2
Chloride: 100
Creatinine, Ser: 0.9
Glucose, Bld: 90
HCT: 40
Hemoglobin: 13.6
Potassium: 3.3 — ABNORMAL LOW
Sodium: 138
TCO2: 32

## 2011-09-10 LAB — D-DIMER, QUANTITATIVE: D-Dimer, Quant: 0.83 — ABNORMAL HIGH

## 2011-09-21 LAB — URINALYSIS, ROUTINE W REFLEX MICROSCOPIC
Bilirubin Urine: NEGATIVE
Ketones, ur: NEGATIVE
Nitrite: NEGATIVE
pH: 6.5

## 2011-09-21 LAB — COMPREHENSIVE METABOLIC PANEL
Albumin: 4.2
Alkaline Phosphatase: 95
BUN: 8
CO2: 27
Chloride: 103
Glucose, Bld: 84
Potassium: 3.8
Total Bilirubin: 0.4

## 2011-09-21 LAB — CBC
HCT: 26.8 — ABNORMAL LOW
HCT: 27.3 — ABNORMAL LOW
HCT: 33.8 — ABNORMAL LOW
HCT: 38.7
Hemoglobin: 11.5 — ABNORMAL LOW
Hemoglobin: 13.2
Hemoglobin: 9 — ABNORMAL LOW
Hemoglobin: 9.3 — ABNORMAL LOW
MCHC: 33.8
MCHC: 34.2
MCV: 88.8
MCV: 89.1
MCV: 89.5
Platelets: 265
RBC: 2.99 — ABNORMAL LOW
RBC: 3.08 — ABNORMAL LOW
RBC: 4.34
RDW: 13.9
RDW: 14.3
WBC: 11.7 — ABNORMAL HIGH
WBC: 20.9 — ABNORMAL HIGH
WBC: 9.4

## 2011-09-21 LAB — TYPE AND SCREEN

## 2011-09-21 LAB — DIFFERENTIAL
Basophils Absolute: 0
Basophils Relative: 0
Monocytes Absolute: 0.7
Neutro Abs: 6.7
Neutrophils Relative %: 72

## 2011-09-21 LAB — APTT: aPTT: 31

## 2011-09-27 LAB — CBC
HCT: 27.1 — ABNORMAL LOW
HCT: 29 — ABNORMAL LOW
Hemoglobin: 9.4 — ABNORMAL LOW
MCV: 93.7
MCV: 93.8
MCV: 94.2
MCV: 92.7
Platelets: 182
Platelets: 268
RBC: 2.88 — ABNORMAL LOW
RBC: 3.1 — ABNORMAL LOW
RBC: 4.44
RDW: 14.8 — ABNORMAL HIGH
WBC: 12.8 — ABNORMAL HIGH
WBC: 13.4 — ABNORMAL HIGH
WBC: 17.1 — ABNORMAL HIGH
WBC: 6.9

## 2011-09-27 LAB — COMPREHENSIVE METABOLIC PANEL
ALT: 18
ALT: 24
ALT: 14
AST: 23
AST: 14
Albumin: 4.5
Alkaline Phosphatase: 57
BUN: 4 — ABNORMAL LOW
CO2: 28
CO2: 30
CO2: 29
Calcium: 8.8
Chloride: 100
Creatinine, Ser: 0.86
Creatinine, Ser: 0.85
GFR calc Af Amer: 29 — ABNORMAL LOW
GFR calc Af Amer: >60
GFR calc non Af Amer: >60
GFR calc non Af Amer: >60
Glucose, Bld: 113 — ABNORMAL HIGH
Glucose, Bld: 147 — ABNORMAL HIGH
Potassium: 2.9 — ABNORMAL LOW
Sodium: 131 — ABNORMAL LOW
Sodium: 135
Sodium: 138
Total Bilirubin: 0.6
Total Protein: 5.6 — ABNORMAL LOW
Total Protein: 6.2

## 2011-09-27 LAB — URINALYSIS, ROUTINE W REFLEX MICROSCOPIC
Bilirubin Urine: NEGATIVE
Glucose, UA: NEGATIVE
Hgb urine dipstick: NEGATIVE
Specific Gravity, Urine: 1.022
Urobilinogen, UA: 0.2
pH: 6

## 2011-09-27 LAB — PROTIME-INR
INR: 2.3 — ABNORMAL HIGH
INR: 2.7 — ABNORMAL HIGH
INR: 0.9
Prothrombin Time: 18.1 — ABNORMAL HIGH
Prothrombin Time: 25.7 — ABNORMAL HIGH
Prothrombin Time: 29.5 — ABNORMAL HIGH

## 2011-09-27 LAB — DIFFERENTIAL
Basophils Relative: 0
Eosinophils Absolute: 0.1
Eosinophils Relative: 1
Monocytes Relative: 9
Neutrophils Relative %: 55

## 2011-09-27 LAB — ABO/RH: ABO/RH(D): O NEG

## 2011-09-27 LAB — TYPE AND SCREEN: Antibody Screen: NEGATIVE

## 2011-10-22 ENCOUNTER — Telehealth: Payer: Self-pay

## 2011-10-22 NOTE — Telephone Encounter (Signed)
Called pt and left message that I'm calling on behalf of the [facility] program to please return our call to the clinics.  Pt needs to know about scheduled appt here at the clinics on 12/02/11 @ 2pm for pelvic pain.

## 2011-11-30 ENCOUNTER — Other Ambulatory Visit: Payer: Self-pay | Admitting: Internal Medicine

## 2011-11-30 DIAGNOSIS — Z1231 Encounter for screening mammogram for malignant neoplasm of breast: Secondary | ICD-10-CM

## 2011-12-02 ENCOUNTER — Ambulatory Visit (INDEPENDENT_AMBULATORY_CARE_PROVIDER_SITE_OTHER): Payer: Self-pay | Admitting: Advanced Practice Midwife

## 2011-12-02 ENCOUNTER — Other Ambulatory Visit: Payer: Self-pay | Admitting: Obstetrics & Gynecology

## 2011-12-02 ENCOUNTER — Encounter: Payer: Self-pay | Admitting: Obstetrics & Gynecology

## 2011-12-02 VITALS — BP 132/93 | HR 85 | Temp 97.3°F | Ht 67.0 in | Wt 215.8 lb

## 2011-12-02 DIAGNOSIS — R102 Pelvic and perineal pain: Secondary | ICD-10-CM

## 2011-12-02 DIAGNOSIS — N949 Unspecified condition associated with female genital organs and menstrual cycle: Secondary | ICD-10-CM

## 2011-12-02 DIAGNOSIS — G8929 Other chronic pain: Secondary | ICD-10-CM

## 2011-12-02 DIAGNOSIS — N898 Other specified noninflammatory disorders of vagina: Secondary | ICD-10-CM

## 2011-12-02 LAB — WET PREP, GENITAL
Trich, Wet Prep: NONE SEEN
Yeast Wet Prep HPF POC: NONE SEEN
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
## 2011-12-08 ENCOUNTER — Telehealth: Payer: Self-pay | Admitting: *Deleted

## 2011-12-08 DIAGNOSIS — B9689 Other specified bacterial agents as the cause of diseases classified elsewhere: Secondary | ICD-10-CM

## 2011-12-08 NOTE — Telephone Encounter (Signed)
Message copied by Jill Side on Wed Dec 08, 2011  9:30 AM ------      Message from: Aviva Signs      Created: Fri Dec 03, 2011 10:48 PM      Regarding: needs treatment       Seen by Dr Marice Potter, result sent to my box??            Has BV per wet prep  >> needs Flagyl 2gm PO once

## 2011-12-08 NOTE — Telephone Encounter (Signed)
Called pt and left message that we are calling about test results. Please leave message indicating if we can leave details on her voice mail.  * Note: confirm pt's pharmacy location and place order for Flagyl 2gm po.

## 2011-12-09 ENCOUNTER — Other Ambulatory Visit: Payer: Self-pay | Admitting: *Deleted

## 2011-12-09 ENCOUNTER — Ambulatory Visit (HOSPITAL_COMMUNITY)
Admission: RE | Admit: 2011-12-09 | Discharge: 2011-12-09 | Disposition: A | Discharge status: Home / Self Care | Payer: Self-pay | Source: Ambulatory Visit | Attending: Obstetrics & Gynecology | Admitting: Obstetrics & Gynecology

## 2011-12-09 DIAGNOSIS — N949 Unspecified condition associated with female genital organs and menstrual cycle: Secondary | ICD-10-CM | POA: Insufficient documentation

## 2011-12-09 DIAGNOSIS — Z90710 Acquired absence of both cervix and uterus: Secondary | ICD-10-CM | POA: Insufficient documentation

## 2011-12-09 DIAGNOSIS — G8929 Other chronic pain: Secondary | ICD-10-CM

## 2011-12-09 MED ORDER — METRONIDAZOLE 500 MG PO TABS: ORAL_TABLET | ORAL | Status: DC

## 2011-12-09 NOTE — Telephone Encounter (Signed)
Called pt and left message to return our call to the clinics.  

## 2011-12-09 NOTE — Telephone Encounter (Signed)
Pt came into clinic today. Pt advised med could be called into [facility] on [address]

## 2012-01-04 ENCOUNTER — Ambulatory Visit (HOSPITAL_COMMUNITY)
Admission: RE | Admit: 2012-01-04 | Discharge: 2012-01-04 | Disposition: A | Discharge status: Home / Self Care | Payer: Self-pay | Source: Ambulatory Visit | Attending: Internal Medicine | Admitting: Internal Medicine

## 2012-01-04 DIAGNOSIS — Z1231 Encounter for screening mammogram for malignant neoplasm of breast: Secondary | ICD-10-CM

## 2012-01-28 ENCOUNTER — Telehealth: Payer: Self-pay

## 2012-01-28 NOTE — Telephone Encounter (Signed)
PT STATES SHE HAS NO INSURANCE AND CANNOT COME IN FOR AN OV UNTIL AFTER MAY 1ST  SHE IS HOPING TO GET HER MEDICATIONS REFILLED BY DR. Merla Riches TO HOLD HER OVER  [phone]

## 2012-01-29 ENCOUNTER — Telehealth: Payer: Self-pay

## 2012-01-29 DIAGNOSIS — N760 Acute vaginitis: Secondary | ICD-10-CM

## 2012-01-29 NOTE — Telephone Encounter (Signed)
Pull chart

## 2012-01-29 NOTE — Telephone Encounter (Signed)
Pt called back and would like to know if we can refill her meds until May 1. Her insurance kicks in and she will see you in the office then. Please advise

## 2012-01-31 MED ORDER — CYCLOBENZAPRINE HCL 10 MG PO TABS: 10.0000 mg | ORAL_TABLET | Freq: Three times a day (TID) | ORAL | Status: DC

## 2012-01-31 MED ORDER — LORATADINE 10 MG PO CAPS: 1.0000 | ORAL_CAPSULE | ORAL | Status: DC

## 2012-01-31 MED ORDER — TRAMADOL HCL 50 MG PO TABS: 50.0000 mg | ORAL_TABLET | Freq: Four times a day (QID) | ORAL | Status: DC | PRN

## 2012-01-31 MED ORDER — MELOXICAM 7.5 MG PO TABS: 15.0000 mg | ORAL_TABLET | Freq: Every day | ORAL | Status: DC

## 2012-01-31 MED ORDER — PAROXETINE HCL 20 MG PO TABS: 20.0000 mg | ORAL_TABLET | ORAL | Status: DC

## 2012-01-31 MED ORDER — ALPRAZOLAM 0.25 MG PO TABS: 0.2500 mg | ORAL_TABLET | Freq: Three times a day (TID) | ORAL | Status: DC | PRN

## 2012-01-31 MED ORDER — SUMATRIPTAN SUCCINATE 100 MG PO TABS: 100.0000 mg | ORAL_TABLET | ORAL | Status: DC | PRN

## 2012-01-31 MED ORDER — LISINOPRIL 20 MG PO TABS: 20.0000 mg | ORAL_TABLET | Freq: Every day | ORAL | Status: DC

## 2012-01-31 NOTE — Telephone Encounter (Signed)
meds to [facility] except alprazolam printed

## 2012-01-31 NOTE — Telephone Encounter (Signed)
Notified pt that Rxs were sent and faxed to pharmacy.

## 2012-01-31 NOTE — Telephone Encounter (Signed)
Chart pulled and in your box.

## 2012-02-16 ENCOUNTER — Other Ambulatory Visit: Payer: Self-pay | Admitting: *Deleted

## 2012-02-23 ENCOUNTER — Other Ambulatory Visit: Payer: Self-pay

## 2012-02-23 NOTE — Telephone Encounter (Signed)
Pt states Dr. Merla Riches was calling in her meds to tide her over until she gets insurance Everything was fine except the Belladonna Alk-Phenobarbital (PHENOBARBITAL-BELLADONNA ALK PO) [facility] [address]

## 2012-04-09 ENCOUNTER — Emergency Department (INDEPENDENT_AMBULATORY_CARE_PROVIDER_SITE_OTHER)
Admission: EM | Admit: 2012-04-09 | Discharge: 2012-04-09 | Disposition: A | Discharge status: Home / Self Care | Payer: Self-pay | Source: Home / Self Care | Attending: Emergency Medicine | Admitting: Emergency Medicine

## 2012-04-09 ENCOUNTER — Emergency Department (INDEPENDENT_AMBULATORY_CARE_PROVIDER_SITE_OTHER): Payer: Self-pay

## 2012-04-09 ENCOUNTER — Encounter (HOSPITAL_COMMUNITY): Payer: Self-pay | Admitting: *Deleted

## 2012-04-09 DIAGNOSIS — S62113A Displaced fracture of triquetrum [cuneiform] bone, unspecified wrist, initial encounter for closed fracture: Secondary | ICD-10-CM

## 2012-04-09 DIAGNOSIS — S5290XA Unspecified fracture of unspecified forearm, initial encounter for closed fracture: Secondary | ICD-10-CM

## 2012-04-09 HISTORY — DX: Dorsalgia, unspecified: M54.9

## 2012-04-09 MED ORDER — HYDROCODONE-ACETAMINOPHEN 5-500 MG PO TABS: 1.0000 | ORAL_TABLET | Freq: Four times a day (QID) | ORAL | Status: AC | PRN

## 2012-04-09 NOTE — Discharge Instructions (Signed)
Followup with Dr. Butler Denmark as discussed try to keep your wrist elevated as much as possible. We discussed both of your fractures.

## 2012-04-09 NOTE — ED Provider Notes (Signed)
History     CSN: [id number]  Arrival date & time 04/09/12  4098   First MD Initiated Contact with Patient 04/09/12 [phone]      Chief Complaint  Patient presents with  . Fall  . Wrist Pain  . Hand Pain  . Back Pain    (Consider location/radiation/quality/duration/timing/severity/associated sxs/prior treatment) HPI Comments: I fell yesterday in the afternoon, was walking on a wet floor and slipped landed on my left wrist and left side of my back. My left wrist has becomes, swollen and tender and can barely move it" pain shoots down to my fingers also mainly the left side in the middle of my back hurts. Patient denies any numbness, tingling, weakness, no urinary symptoms.  Patient is a 46 y.o. female presenting with fall, wrist pain, hand pain, and back pain. The history is provided by the patient.  Fall The accident occurred yesterday. The fall occurred while walking. She fell from a height of 1 to 2 ft. There was no blood loss. The pain is present in the left wrist. The pain is at a severity of 8/10. She was ambulatory at the scene. There was no entrapment after the fall. There was no drug use involved in the accident. There was no alcohol use involved in the accident. Pertinent negatives include no visual change, no fever, no numbness, no abdominal pain, no bowel incontinence, no vomiting, no hematuria, no loss of consciousness and no tingling. The symptoms are aggravated by activity, standing and ambulation. She has tried ice and elevation for the symptoms. The treatment provided no relief.  Wrist Pain Pertinent negatives include no abdominal pain.  Hand Pain Pertinent negatives include no abdominal pain.  Back Pain  Pertinent negatives include no fever, no numbness, no abdominal pain, no bowel incontinence, no tingling and no weakness.    Past Medical History  Diagnosis Date  . Depression   . Hypertension   . HSV (herpes simplex virus) infection   . Migraines   . Mitral valve  prolapse   . Back pain     Past Surgical History  Procedure Date  . Abdominal hysterectomy   . Joint replacement     both knees  . Spine surgery   . Cystectomy   . Shoulder surgery     Family History  Problem Relation Age of Onset  . Cancer Mother   . Cancer Father   . Heart disease Father   . Heart disease Sister   . Hypertension Sister   . Diabetes Sister   . Mental illness Sister   . Fibromyalgia Sister     History  Substance Use Topics  . Smoking status: Never Smoker   . Smokeless tobacco: Never Used  . Alcohol Use: No    OB History    Grav Para Term Preterm Abortions TAB SAB Ect Mult Living   1 1  1      1       Review of Systems  Constitutional: Negative for fever, chills, activity change and appetite change.  Gastrointestinal: Negative for vomiting, abdominal pain and bowel incontinence.  Genitourinary: Negative for hematuria.  Musculoskeletal: Positive for back pain. Negative for gait problem.  Skin: Negative for color change, rash and wound.  Neurological: Negative for tingling, loss of consciousness, weakness and numbness.    Allergies  Codeine  Home Medications   Current Outpatient Rx  Name Route Sig Dispense Refill  . ALPRAZOLAM 0.25 MG PO TABS Oral Take 1 tablet (0.25 mg total) by  mouth 3 (three) times daily as needed. 270 tablet 0  . PHENOBARBITAL-BELLADONNA ALK PO Oral Take 1 tablet by mouth 3 (three) times daily.      . CYCLOBENZAPRINE HCL 10 MG PO TABS Oral Take 1 tablet (10 mg total) by mouth 3 (three) times daily. 90 tablet 0  . LISINOPRIL 20 MG PO TABS Oral Take 1 tablet (20 mg total) by mouth daily. 90 tablet 0  . LORATADINE 10 MG PO CAPS Oral Take 1 capsule (10 mg total) by mouth 1 day or 1 dose. 90 each 0  . MELOXICAM 7.5 MG PO TABS Oral Take 2 tablets (15 mg total) by mouth daily. Takes 7.5 mg bid 90 tablet 0  . PAROXETINE HCL 20 MG PO TABS Oral Take 1 tablet (20 mg total) by mouth every morning. 90 tablet 0  . TRAMADOL HCL 50 MG PO  TABS Oral Take 1 tablet (50 mg total) by mouth every 6 (six) hours as needed. Maximum dose= 8 tablets per day 30 tablet 3  . HYDROCODONE-ACETAMINOPHEN 5-500 MG PO TABS Oral Take 1 tablet by mouth every 6 (six) hours as needed for pain. 30 tablet 0  . HYDROCODONE-ACETAMINOPHEN 5-325 MG PO TABS Oral Take 1 tablet by mouth every 6 (six) hours as needed.      Marland Kitchen METRONIDAZOLE 500 MG PO TABS  2gm po one time dose. 4 tablet 0  . SUMATRIPTAN SUCCINATE 100 MG PO TABS Oral Take 1 tablet (100 mg total) by mouth as needed. 10 tablet 3    BP 115/81  Pulse 78  Temp(Src) 98.2 F (36.8 C) (Oral)  Resp 18  SpO2 98%  Physical Exam  Nursing note and vitals reviewed. Constitutional: She appears well-developed and well-nourished.  HENT:  Mouth/Throat: No oropharyngeal exudate.  Neck: Neck supple.  Pulmonary/Chest: She has no wheezes.  Musculoskeletal: She exhibits tenderness.       Lumbar back: She exhibits decreased range of motion, tenderness, bony tenderness and pain. She exhibits no swelling, no edema, no deformity, no laceration, no spasm and normal pulse.       Back:       Left hand: She exhibits decreased range of motion, tenderness, bony tenderness and deformity. She exhibits normal capillary refill and no laceration.       Hands: Neurological: She is alert. No cranial nerve deficit. Coordination normal.  Skin: No rash noted. No erythema.    ED Course  Procedures (including critical care time)  Labs Reviewed - No data to display Dg Lumbar Spine Complete  04/09/2012  *RADIOLOGY REPORT*  Clinical Data: Larey Seat.  Pain.  LUMBAR SPINE - COMPLETE 4+ VIEW  Comparison: 09/16/2005  Findings: There has been previous discectomy and fusion from L4 to the sacrum.  Fusion appears solid.  No evidence of acute fracture. There is disc space narrowing at L3-4.  There is mild facet degeneration at this level as well.  IMPRESSION: Solid fusion from L4 to the sacrum.  Degenerative disc disease and degenerative facet  disease at L3-4.  No acute or traumatic finding.  Original Report Authenticated By: Thomasenia Sales, M.D.   Dg Wrist Complete Left  04/09/2012  *RADIOLOGY REPORT*  Clinical Data: Fall.  Pain.  LEFT WRIST - COMPLETE 3+ VIEW  Comparison: None.  Findings: There is a nondisplaced fracture of the radial styloid. Small bony density dorsal to the carpus probably indicates triquetral fracture.  No other regional injury.  IMPRESSION: Nondisplaced linear fracture of the radial styloid.  Probable  triquetral fracture.  Original Report Authenticated By: Thomasenia Sales, M.D.     1. Radial fracture   2. Triquetral fracture       MDM  Patient sustained a fall yesterday presents urgent care today with a left wrist deformity and complaining of lumbar pain open (centrally over). X-rays are being performed.      Jimmie Molly, MD 04/09/12 [phone]

## 2012-04-09 NOTE — Progress Notes (Signed)
Orthopedic Tech Progress Note Patient Details:  Carolyn Allen 04/25/1966 [id number]  Type of Splint: Volar Splint Location: left UE Splint Interventions: Application  Per physician, volar splint.  [name] T 04/09/2012, 12:02 PM

## 2012-04-09 NOTE — ED Notes (Signed)
Pt fell yesterday 4 pm on wet floor injured left wrist applied ice and ace wrap - this am painful/swollen - pain radiates to tip of fingers - pt also with c/o of increased left sided low back pain - pt has chronic low back pain worse after fall

## 2012-04-19 ENCOUNTER — Encounter: Payer: Self-pay | Admitting: Internal Medicine

## 2012-04-19 ENCOUNTER — Ambulatory Visit (INDEPENDENT_AMBULATORY_CARE_PROVIDER_SITE_OTHER): Payer: BC Managed Care – PPO | Admitting: Internal Medicine

## 2012-04-19 VITALS — BP 139/97 | HR 78 | Temp 98.7°F | Resp 16 | Ht 67.0 in | Wt 211.4 lb

## 2012-04-19 DIAGNOSIS — G8929 Other chronic pain: Secondary | ICD-10-CM | POA: Insufficient documentation

## 2012-04-19 DIAGNOSIS — F411 Generalized anxiety disorder: Secondary | ICD-10-CM | POA: Insufficient documentation

## 2012-04-19 DIAGNOSIS — J309 Allergic rhinitis, unspecified: Secondary | ICD-10-CM

## 2012-04-19 DIAGNOSIS — M542 Cervicalgia: Secondary | ICD-10-CM | POA: Insufficient documentation

## 2012-04-19 DIAGNOSIS — I10 Essential (primary) hypertension: Secondary | ICD-10-CM

## 2012-04-19 DIAGNOSIS — G43909 Migraine, unspecified, not intractable, without status migrainosus: Secondary | ICD-10-CM

## 2012-04-19 DIAGNOSIS — F329 Major depressive disorder, single episode, unspecified: Secondary | ICD-10-CM | POA: Insufficient documentation

## 2012-04-19 DIAGNOSIS — K589 Irritable bowel syndrome without diarrhea: Secondary | ICD-10-CM | POA: Insufficient documentation

## 2012-04-19 LAB — CBC WITH DIFFERENTIAL/PLATELET
Eosinophils Relative: 1 % (ref 0–5)
HCT: 39.7 % (ref 36.0–46.0)
Hemoglobin: 13.5 g/dL (ref 12.0–15.0)
Lymphocytes Relative: 43 % (ref 12–46)
Lymphs Abs: 1.8 10*3/uL (ref 0.7–4.0)
MCV: 89.4 fL (ref 78.0–100.0)
Monocytes Absolute: 0.4 10*3/uL (ref 0.1–1.0)
Monocytes Relative: 9 % (ref 3–12)
Neutro Abs: 2 10*3/uL (ref 1.7–7.7)
WBC: 4.2 10*3/uL (ref 4.0–10.5)

## 2012-04-19 LAB — LIPID PANEL
HDL: 49 mg/dL (ref >39)
LDL Cholesterol: 89 mg/dL (ref 0–99)
Total CHOL/HDL Ratio: 3.7 Ratio
Triglycerides: 215 mg/dL — ABNORMAL HIGH (ref <150)
VLDL: 43 mg/dL — ABNORMAL HIGH (ref 0–40)

## 2012-04-19 LAB — COMPREHENSIVE METABOLIC PANEL
ALT: 18 U/L (ref 0–35)
AST: 20 U/L (ref 0–37)
Alkaline Phosphatase: 99 U/L (ref 39–117)
CO2: 30 mEq/L (ref 19–32)
Creat: 0.78 mg/dL (ref 0.50–1.10)
Sodium: 140 mEq/L (ref 135–145)
Total Bilirubin: 0.5 mg/dL (ref 0.3–1.2)

## 2012-04-19 MED ORDER — LISINOPRIL-HYDROCHLOROTHIAZIDE 20-12.5 MG PO TABS: 1.0000 | ORAL_TABLET | Freq: Every day | ORAL | Status: DC

## 2012-04-19 MED ORDER — LORATADINE 10 MG PO CAPS: 1.0000 | ORAL_CAPSULE | ORAL | Status: DC

## 2012-04-19 MED ORDER — BELLADONNA ALK-PHENOBARBITAL 16.2 MG PO TABS: 1.0000 | ORAL_TABLET | ORAL | Status: AC | PRN

## 2012-04-19 MED ORDER — PAROXETINE HCL 20 MG PO TABS: 20.0000 mg | ORAL_TABLET | ORAL | Status: DC

## 2012-04-19 MED ORDER — ALPRAZOLAM 0.25 MG PO TABS: 0.2500 mg | ORAL_TABLET | Freq: Three times a day (TID) | ORAL | Status: DC | PRN

## 2012-04-19 MED ORDER — SUMATRIPTAN SUCCINATE 100 MG PO TABS: 100.0000 mg | ORAL_TABLET | ORAL | Status: DC | PRN
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
## 2012-05-10 ENCOUNTER — Telehealth: Payer: Self-pay

## 2012-05-10 NOTE — Telephone Encounter (Signed)
LMOM for pt that Dr Merla Riches is not in office again until Sunday so it will probably be early next week bf PPW is ready. Asked for CB if she needs it sooner. Dr Merla Riches, I have put the PPW in your box.

## 2012-05-10 NOTE — Telephone Encounter (Signed)
The patient came in and dropped off application for renewal of permanent disability parking placard.  Paperwork at [employer].

## 2012-05-15 NOTE — Telephone Encounter (Signed)
Notified pt on VM that form is ready for p/up 

## 2012-05-15 NOTE — Telephone Encounter (Signed)
Call/left form for handicap tag at front

## 2012-07-13 DIAGNOSIS — Z0271 Encounter for disability determination: Secondary | ICD-10-CM

## 2012-08-18 ENCOUNTER — Encounter: Payer: Self-pay | Admitting: Internal Medicine

## 2012-08-24 ENCOUNTER — Telehealth: Payer: Self-pay | Admitting: Radiology

## 2012-08-24 NOTE — Telephone Encounter (Signed)
Patient was given Rx for Belladona/Phenobarbitol, there is no longer a generic for this, it has stopped being manufactured. The brand Rx is $200 and insurance company has rejected this medication. Patient taking for IBS please advise if there is another drug to replace this, pharmacy did not offer any suggestions. Please advise.

## 2012-08-24 NOTE — Telephone Encounter (Signed)
She could try taking it to a compounding pharmacy like [facility] or [facility] and they may be able to make it and it may be cheaper.

## 2012-08-26 NOTE — Telephone Encounter (Signed)
LMOM to CB. 

## 2012-08-27 NOTE — Telephone Encounter (Signed)
LMOM to CB if she needed our help.

## 2012-10-05 ENCOUNTER — Telehealth: Payer: Self-pay

## 2012-10-05 NOTE — Telephone Encounter (Signed)
Patient has taken bella donna for years for IBS, now has been out of it, please advise if anything else we can give her for the IBS, or if she needs OV.

## 2012-10-05 NOTE — Telephone Encounter (Signed)
Have the patient call around to see if other pharmacies have it in stock, or if they can order it.

## 2012-10-05 NOTE — Telephone Encounter (Signed)
PT STATES THE PHARMACY IS OUT OF THE BELLA DONNA AND SHE HAVE BEEN WITHOUT FOR 2 MONTHS NOW AND IS SICK PLEASE CALL [phone]

## 2012-10-06 NOTE — Telephone Encounter (Signed)
Left message to return call 

## 2012-10-07 NOTE — Telephone Encounter (Signed)
Spoke with pt she did try to another pharmacy. She already took care of this.

## 2012-12-11 ENCOUNTER — Telehealth: Payer: Self-pay

## 2012-12-11 NOTE — Telephone Encounter (Signed)
PT STATES THE MELOXICAM SHE IS PRESCRIBED IS REALLY SO EXPENSIVE AND WOULD LIKE TO KNOW IF SHE CAN GET SOMETHING ELSE CHEAPER PLEASE CALL [phone]

## 2012-12-12 NOTE — Telephone Encounter (Signed)
It is on $4 list at [facility], called her to advise. Left message to see if she wants to change this.

## 2012-12-12 NOTE — Telephone Encounter (Signed)
Wrong message. She states she has been taking Belladonna for years but it has been discontinued but now has to take Donnatal it is very expensive and she can not afford it. Please advise if there is anything else we can give her to substitute.

## 2012-12-13 NOTE — Telephone Encounter (Signed)
Please advise her to RTC to discuss alternative treatments.

## 2012-12-13 NOTE — Telephone Encounter (Signed)
Patient states she is having spinal stimulator placed soon, and she would have a difficult time coming in for office visit. She has been taking Belladona for years, has been discontinued, has been getting Donatal, taking qid is very expensive, has tried taking it less frequently, but not effective at less frequent intervals. She wants to know if she can get something else, she vomits after eating if she does not use this medication.

## 2012-12-14 MED ORDER — DICYCLOMINE HCL 20 MG PO TABS: 20.0000 mg | ORAL_TABLET | Freq: Four times a day (QID) | ORAL | Status: DC

## 2012-12-14 NOTE — Telephone Encounter (Signed)
LMOM for pt w/new Rx info and instr's to CB in 1 week to let us know how it is working. Called in Rx to [facility]

## 2012-12-14 NOTE — Telephone Encounter (Signed)
Try bentyl-for 1 week and let us know if it works Meds ordered this encounter  Medications  . dicyclomine (BENTYL) 20 MG tablet    Sig: Take 1 tablet (20 mg total) by mouth every 6 (six) hours. Before meals and at bedtime    Dispense:  30 tablet    Refill:  0  phone in

## 2012-12-24 ENCOUNTER — Ambulatory Visit (HOSPITAL_COMMUNITY): Admission: RE | Admit: 2012-12-24 | Payer: BC Managed Care – PPO | Source: Ambulatory Visit

## 2012-12-24 ENCOUNTER — Emergency Department (HOSPITAL_COMMUNITY)
Admission: EM | Admit: 2012-12-24 | Discharge: 2012-12-24 | Disposition: A | Discharge status: Home / Self Care | Payer: BC Managed Care – PPO | Attending: Emergency Medicine | Admitting: Emergency Medicine

## 2012-12-24 DIAGNOSIS — Z8619 Personal history of other infectious and parasitic diseases: Secondary | ICD-10-CM | POA: Insufficient documentation

## 2012-12-24 DIAGNOSIS — Z79899 Other long term (current) drug therapy: Secondary | ICD-10-CM | POA: Insufficient documentation

## 2012-12-24 DIAGNOSIS — I10 Essential (primary) hypertension: Secondary | ICD-10-CM | POA: Insufficient documentation

## 2012-12-24 DIAGNOSIS — J3489 Other specified disorders of nose and nasal sinuses: Secondary | ICD-10-CM | POA: Insufficient documentation

## 2012-12-24 DIAGNOSIS — R040 Epistaxis: Secondary | ICD-10-CM

## 2012-12-24 DIAGNOSIS — F329 Major depressive disorder, single episode, unspecified: Secondary | ICD-10-CM | POA: Insufficient documentation

## 2012-12-24 DIAGNOSIS — J329 Chronic sinusitis, unspecified: Secondary | ICD-10-CM

## 2012-12-24 DIAGNOSIS — F3289 Other specified depressive episodes: Secondary | ICD-10-CM | POA: Insufficient documentation

## 2012-12-24 DIAGNOSIS — Z8679 Personal history of other diseases of the circulatory system: Secondary | ICD-10-CM | POA: Insufficient documentation

## 2012-12-24 MED ORDER — AMOXICILLIN 500 MG PO CAPS: 500.0000 mg | ORAL_CAPSULE | Freq: Three times a day (TID) | ORAL | Status: DC

## 2012-12-24 MED ORDER — OXYCODONE-ACETAMINOPHEN 5-325 MG PO TABS
1.0000 | ORAL_TABLET | Freq: Once | ORAL | Status: AC
  Administered 2012-12-24: 1 via ORAL
  Filled 2012-12-24: qty 1

## 2012-12-24 NOTE — ED Provider Notes (Signed)
Medical screening examination/treatment/procedure(s) were performed by non-physician practitioner and as supervising physician I was immediately available for consultation/collaboration.  Ronisha Herringshaw K Brightyn Mozer-Rasch, MD 12/24/12 0454 

## 2012-12-24 NOTE — ED Notes (Signed)
MVH:[id number]<GE> Expected date:<BR> Expected time:<BR> Means of arrival:<BR> Comments:<BR> Medic 261, 50 F, Nosebleed

## 2012-12-24 NOTE — ED Provider Notes (Signed)
History     CSN: [id number]  Arrival date & time 12/24/12  4098   First MD Initiated Contact with Patient 12/24/12 [phone]      Chief Complaint  Patient presents with  . Epistaxis    (Consider location/radiation/quality/duration/timing/severity/associated sxs/prior treatment) HPI  Patient brought in by EMS for nose bleed. The patient states that she had never had a nosebleed and  once it started because it scared her she called EMS right away. He denies having pain or headache.  She has had nasal congestion for the past two weeks with sinus pressure, and green discharge. She has  been using OTC de congestants and nasal sprays at home for her symptoms but she continues to have  the congestion. With Afrin nasal spray administered but EMS the patient nose stopped bleeding en route.  EMS documented that her BP was 206/104 but here in the ED it is 144/97. nad vss  Past Medical History  Diagnosis Date  . Depression   . Hypertension   . HSV (herpes simplex virus) infection   . Migraines   . Mitral valve prolapse   . Back pain     Past Surgical History  Procedure Date  . Abdominal hysterectomy   . Joint replacement     both knees  . Spine surgery   . Cystectomy   . Shoulder surgery     Family History  Problem Relation Age of Onset  . Cancer Mother   . Cancer Father   . Heart disease Father   . Heart disease Sister   . Hypertension Sister   . Diabetes Sister   . Mental illness Sister   . Fibromyalgia Sister     History  Substance Use Topics  . Smoking status: Never Smoker   . Smokeless tobacco: Never Used  . Alcohol Use: No    OB History    Grav Para Term Preterm Abortions TAB SAB Ect Mult Living   1 1  1      1       Review of Systems  Review of Systems  Gen: no weight loss, fevers, chills, night sweats  Eyes: no discharge or drainage, no occular pain or visual changes  Nose: + epistaxis or rhinorrhea  Mouth: no dental pain, no sore throat  Neck: no  neck pain  Lungs:No wheezing, coughing or hemoptysis CV: no chest pain, palpitations, dependent edema or orthopnea  Abd: no abdominal pain, nausea, vomiting  GU: no dysuria or gross hematuria  MSK:  No abnormalities  Neuro: no headache, no focal neurologic deficits  Skin: no abnormalities Psyche: negative.   Allergies  Codeine  Home Medications   Current Outpatient Rx  Name  Route  Sig  Dispense  Refill  . ALPRAZOLAM 0.25 MG PO TABS   Oral   Take 1 tablet (0.25 mg total) by mouth 3 (three) times daily as needed.   270 tablet   1   . CYCLOBENZAPRINE HCL 10 MG PO TABS   Oral   Take 1 tablet (10 mg total) by mouth 3 (three) times daily.   90 tablet   0   . DICYCLOMINE HCL 20 MG PO TABS   Oral   Take 1 tablet (20 mg total) by mouth every 6 (six) hours. Before meals and at bedtime   30 tablet   0   . HYDROCODONE-ACETAMINOPHEN 5-325 MG PO TABS   Oral   Take 1 tablet by mouth every 6 (six) hours as needed.           Marland Kitchen  LISINOPRIL-HYDROCHLOROTHIAZIDE 20-12.5 MG PO TABS   Oral   Take 1 tablet by mouth daily.   90 tablet   3   . LORATADINE 10 MG PO CAPS   Oral   Take 1 capsule (10 mg total) by mouth 1 day or 1 dose.   90 each   3   . MELOXICAM 7.5 MG PO TABS   Oral   Take 7.5 mg by mouth 2 (two) times daily.         Marland Kitchen PAROXETINE HCL 20 MG PO TABS   Oral   Take 1 tablet (20 mg total) by mouth every morning.   90 tablet   3   . SUMATRIPTAN SUCCINATE 100 MG PO TABS   Oral   Take 1 tablet (100 mg total) by mouth as needed.   10 tablet   3   . TRAMADOL HCL 50 MG PO TABS   Oral   Take 1 tablet (50 mg total) by mouth every 6 (six) hours as needed. Maximum dose= 8 tablets per day   30 tablet   3   . AMOXICILLIN 500 MG PO CAPS   Oral   Take 1 capsule (500 mg total) by mouth 3 (three) times daily.   21 capsule   0     BP 146/88  Pulse 75  Temp 98.4 F (36.9 C) (Oral)  Resp 16  SpO2 99%  Physical Exam  Nursing note and vitals  reviewed. Constitutional: She appears well-developed and well-nourished. No distress.  HENT:  Head: Normocephalic and atraumatic.  Nose: Rhinorrhea present. No nose lacerations, sinus tenderness, septal deviation or nasal septal hematoma. Epistaxis (small skin tear noted in the upper right nare) is observed.  Eyes: Pupils are equal, round, and reactive to light.  Neck: Normal range of motion. Neck supple.  Cardiovascular: Normal rate and regular rhythm.   Pulmonary/Chest: Effort normal.  Abdominal: Soft.  Neurological: She is alert.  Skin: Skin is warm and dry.    ED Course  Procedures (including critical care time)  Labs Reviewed - No data to display No results found.   1. Sinusitis   2. Epistaxis       MDM  Bleeding stopped prior to arrival. I was able to visualize site of bleeding without hematoma. Pt education given on how to treat nose bleeds. If after 30 minutes it does not resolve she needs to c ome back in. Do to chronic sinusitis symptoms will tx with abx.  Pt has been advised of the symptoms that warrant their return to the ED. Patient has voiced understanding and has agreed to follow-up with the PCP or specialist.         Dorthula Matas, PA 12/24/12 5409

## 2012-12-24 NOTE — ED Notes (Signed)
Pt present from home via EMS with c/o nosebleed.  Pt vitals BP 206/104, HR 88, resp 18. Pt reports OTC decongestants and congestion x1 week.  Pt given 2 sprays affrin via ems to stop bleeding.

## 2013-01-10 ENCOUNTER — Other Ambulatory Visit: Payer: Self-pay | Admitting: Internal Medicine

## 2013-01-10 DIAGNOSIS — Z1231 Encounter for screening mammogram for malignant neoplasm of breast: Secondary | ICD-10-CM

## 2013-01-11 ENCOUNTER — Ambulatory Visit (HOSPITAL_COMMUNITY)
Admission: RE | Admit: 2013-01-11 | Discharge: 2013-01-11 | Disposition: A | Discharge status: Home / Self Care | Payer: Self-pay | Source: Ambulatory Visit | Attending: Internal Medicine | Admitting: Internal Medicine

## 2013-01-11 DIAGNOSIS — Z1231 Encounter for screening mammogram for malignant neoplasm of breast: Secondary | ICD-10-CM

## 2013-01-14 ENCOUNTER — Other Ambulatory Visit: Payer: Self-pay | Admitting: Internal Medicine

## 2013-02-25 ENCOUNTER — Other Ambulatory Visit: Payer: Self-pay | Admitting: Internal Medicine

## 2013-02-28 ENCOUNTER — Telehealth: Payer: Self-pay

## 2013-02-28 NOTE — Telephone Encounter (Signed)
PT STATES SHE DOESN'T HAVE INSURANCE AND CANNOT AFFORD TO COME IN RIGHT NOW, BUT HER STOMACH IS BOTHERING HER AND SHE REALLY NEED A REFILL ON THE MEDICINE WE HAD GIVEN HER FOR IT. PLEASE CALL [phone]    [facility] ON [address]

## 2013-02-28 NOTE — Telephone Encounter (Signed)
Dicyclomine refill being requested.

## 2013-02-28 NOTE — Telephone Encounter (Signed)
Left message which medication does she request?

## 2013-03-01 MED ORDER — DICYCLOMINE HCL 20 MG PO TABS: 20.0000 mg | ORAL_TABLET | Freq: Four times a day (QID) | ORAL | Status: DC

## 2013-03-01 NOTE — Telephone Encounter (Signed)
Called patient to advise  °

## 2013-03-01 NOTE — Telephone Encounter (Signed)
She states her stomach bothers her if she does not take the Dicyclomine, she is requesting refill on this. She states it is the only thing that has been helpful.

## 2013-03-01 NOTE — Telephone Encounter (Signed)
Well, how is her stomach really bothering her? Please find out what' s going on?

## 2013-03-01 NOTE — Telephone Encounter (Signed)
I have sent a one month supply to her pharmacy, but she must come in and be seen before this runs out.  She has not been seen in almost 1 yr.  No further refills after this unless pt is seen in the office.

## 2013-07-06 ENCOUNTER — Ambulatory Visit: Payer: BC Managed Care – PPO | Admitting: Internal Medicine

## 2013-07-06 VITALS — BP 148/90 | HR 87 | Temp 98.2°F | Resp 16 | Ht 67.0 in | Wt 198.0 lb

## 2013-07-06 DIAGNOSIS — K589 Irritable bowel syndrome without diarrhea: Secondary | ICD-10-CM

## 2013-07-06 DIAGNOSIS — F411 Generalized anxiety disorder: Secondary | ICD-10-CM

## 2013-07-06 DIAGNOSIS — M549 Dorsalgia, unspecified: Secondary | ICD-10-CM

## 2013-07-06 DIAGNOSIS — I10 Essential (primary) hypertension: Secondary | ICD-10-CM

## 2013-07-06 DIAGNOSIS — J309 Allergic rhinitis, unspecified: Secondary | ICD-10-CM

## 2013-07-06 DIAGNOSIS — G43909 Migraine, unspecified, not intractable, without status migrainosus: Secondary | ICD-10-CM

## 2013-07-06 DIAGNOSIS — M5030 Other cervical disc degeneration, unspecified cervical region: Secondary | ICD-10-CM

## 2013-07-06 DIAGNOSIS — M542 Cervicalgia: Secondary | ICD-10-CM

## 2013-07-06 DIAGNOSIS — F329 Major depressive disorder, single episode, unspecified: Secondary | ICD-10-CM

## 2013-07-06 MED ORDER — ALPRAZOLAM 0.25 MG PO TABS: 0.2500 mg | ORAL_TABLET | Freq: Three times a day (TID) | ORAL | Status: DC | PRN

## 2013-07-06 MED ORDER — PAROXETINE HCL 20 MG PO TABS: 20.0000 mg | ORAL_TABLET | ORAL | Status: DC

## 2013-07-06 MED ORDER — LISINOPRIL-HYDROCHLOROTHIAZIDE 20-12.5 MG PO TABS: 1.0000 | ORAL_TABLET | Freq: Every day | ORAL | Status: DC

## 2013-07-06 MED ORDER — HYDROCODONE-ACETAMINOPHEN 5-325 MG PO TABS: 1.0000 | ORAL_TABLET | Freq: Every day | ORAL | Status: DC

## 2013-07-06 MED ORDER — DICYCLOMINE HCL 20 MG PO TABS: 20.0000 mg | ORAL_TABLET | Freq: Four times a day (QID) | ORAL | Status: DC

## 2013-07-06 MED ORDER — MELOXICAM 7.5 MG PO TABS: 7.5000 mg | ORAL_TABLET | Freq: Two times a day (BID) | ORAL | Status: DC

## 2013-07-06 MED ORDER — CYCLOBENZAPRINE HCL 10 MG PO TABS: 10.0000 mg | ORAL_TABLET | Freq: Three times a day (TID) | ORAL | Status: DC

## 2013-07-06 MED ORDER — TRAMADOL HCL 50 MG PO TABS: 50.0000 mg | ORAL_TABLET | Freq: Four times a day (QID) | ORAL | Status: DC | PRN
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
## 2013-08-27 ENCOUNTER — Encounter: Payer: Self-pay | Admitting: Internal Medicine

## 2013-09-07 ENCOUNTER — Telehealth: Payer: Self-pay

## 2013-09-07 NOTE — Telephone Encounter (Signed)
Can you give clearance for surgical placement of spinal cord stimulator? Or does patient need to be seen?

## 2013-09-07 NOTE — Telephone Encounter (Signed)
Patient calling to check the status of the forms she dropped of this morning.  [phone]

## 2013-09-07 NOTE — Telephone Encounter (Signed)
Pt brought in form from [facility] for Dr Jule Ser signiture. States they are waiting to schedule her neurostimulator surgery until they receive that form from dr Merla Riches.   Gave form to [employer] (TL at time pt came in to office) Form is at nurses station.  Pt states you can fax form and asks for call when completed.  bf

## 2013-10-02 ENCOUNTER — Encounter (HOSPITAL_COMMUNITY): Payer: Self-pay | Admitting: Pharmacy Technician
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
## 2013-10-02 NOTE — Pre-Procedure Instructions (Signed)
Carolyn Allen  10/02/2013   Your procedure is scheduled on:  Monday, October 27th  Report to Main Entrance "A" and check in with admitting at 1200 PM.  Call this number if you have problems the morning of surgery: [phone]   Remember:   Do not eat food or drink liquids after midnight.   Take these medicines the morning of surgery with A SIP OF WATER: paxil, loratadine, vicodin if needed, xanax if needed   Do not wear jewelry, make-up or nail polish.  Do not wear lotions, powders, or perfumes. You may wear deodorant.  Do not shave 48 hours prior to surgery. Men may shave face and neck.  Do not bring valuables to the hospital.  [facility] is not responsible  for any belongings or valuables.               Contacts, dentures or bridgework may not be worn into surgery.  Leave suitcase in the car. After surgery it may be brought to your room.  For patients admitted to the hospital, discharge time is determined by your treatment team.               Patients discharged the day of surgery will not be allowed to drive home.    Special Instructions: Shower using CHG 2 nights before surgery and the night before surgery.  If you shower the day of surgery use CHG.  Use special wash - you have one bottle of CHG for all showers.  You should use approximately 1/3 of the bottle for each shower.   Please read over the following fact sheets that you were given: Pain Booklet, Coughing and Deep Breathing and Surgical Site Infection Prevention

## 2013-10-03 ENCOUNTER — Encounter (HOSPITAL_COMMUNITY)
Admission: RE | Admit: 2013-10-03 | Discharge: 2013-10-03 | Disposition: A | Discharge status: Home / Self Care | Payer: BC Managed Care – PPO | Source: Ambulatory Visit | Attending: Orthopedic Surgery | Admitting: Orthopedic Surgery

## 2013-10-03 ENCOUNTER — Encounter (HOSPITAL_COMMUNITY)
Admission: RE | Admit: 2013-10-03 | Discharge: 2013-10-03 | Disposition: A | Discharge status: Home / Self Care | Payer: BC Managed Care – PPO | Source: Ambulatory Visit | Attending: Anesthesiology | Admitting: Anesthesiology

## 2013-10-03 ENCOUNTER — Encounter (HOSPITAL_COMMUNITY): Payer: Self-pay

## 2013-10-03 DIAGNOSIS — Z01812 Encounter for preprocedural laboratory examination: Secondary | ICD-10-CM | POA: Insufficient documentation

## 2013-10-03 DIAGNOSIS — Z01818 Encounter for other preprocedural examination: Secondary | ICD-10-CM | POA: Insufficient documentation

## 2013-10-03 DIAGNOSIS — Z01810 Encounter for preprocedural cardiovascular examination: Secondary | ICD-10-CM | POA: Insufficient documentation

## 2013-10-03 HISTORY — DX: Other specified postprocedural states: R11.2

## 2013-10-03 HISTORY — DX: Nausea with vomiting, unspecified: Z98.890

## 2013-10-03 HISTORY — DX: Unspecified temporomandibular joint disorder, unspecified side: M26.609

## 2013-10-03 LAB — CBC
MCH: 30 pg (ref 26.0–34.0)
MCHC: 34.2 g/dL (ref 30.0–36.0)
Platelets: 245 10*3/uL (ref 150–400)
RDW: 13.9 % (ref 11.5–15.5)
WBC: 5.5 10*3/uL (ref 4.0–10.5)

## 2013-10-03 LAB — BASIC METABOLIC PANEL
BUN: 13 mg/dL (ref 6–23)
Calcium: 9.9 mg/dL (ref 8.4–10.5)
Creatinine, Ser: 0.81 mg/dL (ref 0.50–1.10)
GFR calc Af Amer: >90 mL/min (ref >90)
GFR calc non Af Amer: 86 mL/min — ABNORMAL LOW (ref >90)
Glucose, Bld: 74 mg/dL (ref 70–99)
Potassium: 3.1 mEq/L — ABNORMAL LOW (ref 3.5–5.1)

## 2013-10-04 NOTE — Progress Notes (Signed)
Anesthesia Chart Review:  Patient is a 47 year old female scheduled for spinal cord stimulator placement on 10/08/13 by Dr. Shon Baton.  History includes non-smoker, post-operative N/V, HTN, MVP, TMJ, migraines, depression, arthritis, obesity, bilateral TKA '08, HSV, hysterectomy, prior back surgery.  PCP is listed as Dr. Ellamae Sia.  EKG on 10/03/13 showed NSR.  Preoperative labs and CXR noted.  Anticipate that she can proceed as planned.  Velna Ochs [facility] Short Stay Center/Anesthesiology Phone [phone] 10/04/2013 10:23 AM

## 2013-10-05 NOTE — Progress Notes (Signed)
Patient notified of time change instructed to arrive at 10:00

## 2013-10-07 MED ORDER — CEFAZOLIN SODIUM-DEXTROSE 2-3 GM-% IV SOLR
2.0000 g | INTRAVENOUS | Status: AC
  Administered 2013-10-08: 2 g via INTRAVENOUS
  Filled 2013-10-07: qty 50

## 2013-10-07 MED ORDER — ACETAMINOPHEN 10 MG/ML IV SOLN
1000.0000 mg | Freq: Four times a day (QID) | INTRAVENOUS | Status: DC
  Administered 2013-10-08: 1000 mg via INTRAVENOUS

## 2013-10-08 ENCOUNTER — Encounter (HOSPITAL_COMMUNITY): Payer: Self-pay | Admitting: Anesthesiology

## 2013-10-08 ENCOUNTER — Ambulatory Visit (HOSPITAL_COMMUNITY): Payer: BC Managed Care – PPO

## 2013-10-08 ENCOUNTER — Observation Stay (HOSPITAL_COMMUNITY)
Admission: RE | Admit: 2013-10-08 | Discharge: 2013-10-09 | Disposition: A | Discharge status: Home / Self Care | Payer: BC Managed Care – PPO | Source: Ambulatory Visit | Attending: Orthopedic Surgery | Admitting: Orthopedic Surgery

## 2013-10-08 ENCOUNTER — Encounter (HOSPITAL_COMMUNITY): Payer: BC Managed Care – PPO | Admitting: Vascular Surgery

## 2013-10-08 ENCOUNTER — Encounter (HOSPITAL_COMMUNITY)
Admission: RE | Disposition: A | Discharge status: Home / Self Care | Payer: Self-pay | Source: Ambulatory Visit | Attending: Orthopedic Surgery

## 2013-10-08 ENCOUNTER — Ambulatory Visit (HOSPITAL_COMMUNITY): Payer: BC Managed Care – PPO | Admitting: Anesthesiology

## 2013-10-08 DIAGNOSIS — G8929 Other chronic pain: Secondary | ICD-10-CM | POA: Insufficient documentation

## 2013-10-08 DIAGNOSIS — F3289 Other specified depressive episodes: Secondary | ICD-10-CM | POA: Insufficient documentation

## 2013-10-08 DIAGNOSIS — M961 Postlaminectomy syndrome, not elsewhere classified: Principal | ICD-10-CM | POA: Insufficient documentation

## 2013-10-08 DIAGNOSIS — Z79899 Other long term (current) drug therapy: Secondary | ICD-10-CM | POA: Insufficient documentation

## 2013-10-08 DIAGNOSIS — I059 Rheumatic mitral valve disease, unspecified: Secondary | ICD-10-CM | POA: Insufficient documentation

## 2013-10-08 DIAGNOSIS — F329 Major depressive disorder, single episode, unspecified: Secondary | ICD-10-CM | POA: Insufficient documentation

## 2013-10-08 DIAGNOSIS — I10 Essential (primary) hypertension: Secondary | ICD-10-CM | POA: Insufficient documentation

## 2013-10-08 HISTORY — PX: SPINAL CORD STIMULATOR INSERTION: SHX5378

## 2013-10-08 SURGERY — INSERTION, SPINAL CORD STIMULATOR, LUMBAR
Anesthesia: General | Site: Spine Lumbar | Wound class: Clean

## 2013-10-08 MED ORDER — OXYCODONE HCL 5 MG PO TABS: 5.0000 mg | ORAL_TABLET | Freq: Once | ORAL | Status: DC | PRN

## 2013-10-08 MED ORDER — THROMBIN 20000 UNITS EX SOLR
CUTANEOUS | Status: AC
  Filled 2013-10-08: qty 20000

## 2013-10-08 MED ORDER — LIDOCAINE HCL (CARDIAC) 20 MG/ML IV SOLN
INTRAVENOUS | Status: DC | PRN
  Administered 2013-10-08: 80 mg via INTRAVENOUS

## 2013-10-08 MED ORDER — METHOCARBAMOL 100 MG/ML IJ SOLN
500.0000 mg | INTRAMUSCULAR | Status: DC
  Filled 2013-10-08: qty 5

## 2013-10-08 MED ORDER — METHOCARBAMOL 750 MG PO TABS
750.0000 mg | ORAL_TABLET | Freq: Four times a day (QID) | ORAL | Status: DC | PRN
  Administered 2013-10-09 (×2): 750 mg via ORAL
  Filled 2013-10-08 (×2): qty 1

## 2013-10-08 MED ORDER — ALPRAZOLAM 0.25 MG PO TABS
0.2500 mg | ORAL_TABLET | Freq: Three times a day (TID) | ORAL | Status: DC | PRN
  Administered 2013-10-08: 0.25 mg via ORAL
  Filled 2013-10-08: qty 1

## 2013-10-08 MED ORDER — HYDROCHLOROTHIAZIDE 12.5 MG PO CAPS
12.5000 mg | ORAL_CAPSULE | Freq: Every day | ORAL | Status: DC
  Administered 2013-10-08 – 2013-10-09 (×2): 12.5 mg via ORAL
  Filled 2013-10-08 (×2): qty 1

## 2013-10-08 MED ORDER — CEFAZOLIN SODIUM 1-5 GM-% IV SOLN
1.0000 g | Freq: Three times a day (TID) | INTRAVENOUS | Status: AC
  Administered 2013-10-08 – 2013-10-09 (×2): 1 g via INTRAVENOUS
  Filled 2013-10-08 (×2): qty 50

## 2013-10-08 MED ORDER — MENTHOL 3 MG MT LOZG: 1.0000 | LOZENGE | OROMUCOSAL | Status: DC | PRN

## 2013-10-08 MED ORDER — SODIUM CHLORIDE 0.9 % IJ SOLN: 3.0000 mL | INTRAMUSCULAR | Status: DC | PRN

## 2013-10-08 MED ORDER — 0.9 % SODIUM CHLORIDE (POUR BTL) OPTIME
TOPICAL | Status: DC | PRN
  Administered 2013-10-08: 1000 mL

## 2013-10-08 MED ORDER — GLYCOPYRROLATE 0.2 MG/ML IJ SOLN
INTRAMUSCULAR | Status: DC | PRN
  Administered 2013-10-08: 0.6 mg via INTRAVENOUS

## 2013-10-08 MED ORDER — OXYCODONE HCL 5 MG PO TABS
10.0000 mg | ORAL_TABLET | ORAL | Status: DC | PRN
  Administered 2013-10-08 – 2013-10-09 (×5): 10 mg via ORAL
  Filled 2013-10-08 (×5): qty 2

## 2013-10-08 MED ORDER — THROMBIN 20000 UNITS EX SOLR
CUTANEOUS | Status: DC | PRN
  Administered 2013-10-08: 13:00:00

## 2013-10-08 MED ORDER — LISINOPRIL-HYDROCHLOROTHIAZIDE 20-12.5 MG PO TABS: 1.0000 | ORAL_TABLET | Freq: Every day | ORAL | Status: DC

## 2013-10-08 MED ORDER — DEXAMETHASONE SODIUM PHOSPHATE 4 MG/ML IJ SOLN
4.0000 mg | Freq: Four times a day (QID) | INTRAMUSCULAR | Status: DC
  Filled 2013-10-08 (×7): qty 1

## 2013-10-08 MED ORDER — PROPOFOL 10 MG/ML IV BOLUS
INTRAVENOUS | Status: DC | PRN
  Administered 2013-10-08: 200 mg via INTRAVENOUS

## 2013-10-08 MED ORDER — BUPIVACAINE-EPINEPHRINE 0.25% -1:200000 IJ SOLN
INTRAMUSCULAR | Status: DC | PRN
  Administered 2013-10-08: 10 mL

## 2013-10-08 MED ORDER — METHOCARBAMOL 100 MG/ML IJ SOLN
500.0000 mg | Freq: Four times a day (QID) | INTRAVENOUS | Status: DC | PRN
  Filled 2013-10-08: qty 5

## 2013-10-08 MED ORDER — METHOCARBAMOL 500 MG PO TABS
500.0000 mg | ORAL_TABLET | Freq: Four times a day (QID) | ORAL | Status: DC | PRN
  Administered 2013-10-08: 500 mg via ORAL
  Filled 2013-10-08: qty 1

## 2013-10-08 MED ORDER — BUPIVACAINE-EPINEPHRINE PF 0.25-1:200000 % IJ SOLN
INTRAMUSCULAR | Status: AC
  Filled 2013-10-08: qty 30

## 2013-10-08 MED ORDER — ACETAMINOPHEN 10 MG/ML IV SOLN
1000.0000 mg | Freq: Four times a day (QID) | INTRAVENOUS | Status: DC
  Administered 2013-10-08 – 2013-10-09 (×3): 1000 mg via INTRAVENOUS
  Filled 2013-10-08 (×5): qty 100

## 2013-10-08 MED ORDER — SODIUM CHLORIDE 0.9 % IV SOLN: 250.0000 mL | INTRAVENOUS | Status: DC

## 2013-10-08 MED ORDER — HEMOSTATIC AGENTS (NO CHARGE) OPTIME
TOPICAL | Status: DC | PRN
  Administered 2013-10-08: 1

## 2013-10-08 MED ORDER — ONDANSETRON HCL 4 MG/2ML IJ SOLN: 4.0000 mg | INTRAMUSCULAR | Status: DC | PRN

## 2013-10-08 MED ORDER — SODIUM CHLORIDE 0.9 % IJ SOLN: 3.0000 mL | Freq: Two times a day (BID) | INTRAMUSCULAR | Status: DC

## 2013-10-08 MED ORDER — FENTANYL CITRATE 0.05 MG/ML IJ SOLN
INTRAMUSCULAR | Status: DC | PRN
  Administered 2013-10-08: 150 ug via INTRAVENOUS

## 2013-10-08 MED ORDER — OXYCODONE HCL 5 MG/5ML PO SOLN: 5.0000 mg | Freq: Once | ORAL | Status: DC | PRN

## 2013-10-08 MED ORDER — ZOLPIDEM TARTRATE 5 MG PO TABS: 5.0000 mg | ORAL_TABLET | Freq: Every evening | ORAL | Status: DC | PRN

## 2013-10-08 MED ORDER — LACTATED RINGERS IV SOLN
INTRAVENOUS | Status: DC
  Administered 2013-10-08: 22:00:00 via INTRAVENOUS

## 2013-10-08 MED ORDER — ROCURONIUM BROMIDE 100 MG/10ML IV SOLN
INTRAVENOUS | Status: DC | PRN
  Administered 2013-10-08: 50 mg via INTRAVENOUS

## 2013-10-08 MED ORDER — LACTATED RINGERS IV SOLN
INTRAVENOUS | Status: DC | PRN
  Administered 2013-10-08 (×2): via INTRAVENOUS

## 2013-10-08 MED ORDER — PROMETHAZINE HCL 25 MG/ML IJ SOLN: 6.2500 mg | INTRAMUSCULAR | Status: DC | PRN

## 2013-10-08 MED ORDER — NEOSTIGMINE METHYLSULFATE 1 MG/ML IJ SOLN
INTRAMUSCULAR | Status: DC | PRN
  Administered 2013-10-08: 3 mg via INTRAVENOUS

## 2013-10-08 MED ORDER — DEXAMETHASONE 4 MG PO TABS
4.0000 mg | ORAL_TABLET | Freq: Four times a day (QID) | ORAL | Status: DC
  Administered 2013-10-08 – 2013-10-09 (×4): 4 mg via ORAL
  Filled 2013-10-08 (×7): qty 1

## 2013-10-08 MED ORDER — MIDAZOLAM HCL 5 MG/5ML IJ SOLN
INTRAMUSCULAR | Status: DC | PRN
  Administered 2013-10-08: 2 mg via INTRAVENOUS

## 2013-10-08 MED ORDER — PHENOL 1.4 % MT LIQD: 1.0000 | OROMUCOSAL | Status: DC | PRN

## 2013-10-08 MED ORDER — HYDROMORPHONE HCL PF 1 MG/ML IJ SOLN
INTRAMUSCULAR | Status: AC
  Administered 2013-10-08: 0.5 mg via INTRAVENOUS
  Filled 2013-10-08: qty 2

## 2013-10-08 MED ORDER — ONDANSETRON HCL 4 MG/2ML IJ SOLN
INTRAMUSCULAR | Status: DC | PRN
  Administered 2013-10-08: 4 mg via INTRAVENOUS

## 2013-10-08 MED ORDER — LISINOPRIL 20 MG PO TABS
20.0000 mg | ORAL_TABLET | Freq: Every day | ORAL | Status: DC
  Administered 2013-10-08 – 2013-10-09 (×2): 20 mg via ORAL
  Filled 2013-10-08 (×2): qty 1

## 2013-10-08 MED ORDER — HYDROMORPHONE HCL PF 1 MG/ML IJ SOLN
0.2500 mg | INTRAMUSCULAR | Status: DC | PRN
  Administered 2013-10-08 (×4): 0.5 mg via INTRAVENOUS

## 2013-10-08 MED ORDER — LACTATED RINGERS IV SOLN
INTRAVENOUS | Status: DC
  Administered 2013-10-08: 10:00:00 via INTRAVENOUS

## 2013-10-08 MED ORDER — PAROXETINE HCL 20 MG PO TABS
20.0000 mg | ORAL_TABLET | Freq: Every day | ORAL | Status: DC
  Administered 2013-10-08 – 2013-10-09 (×2): 20 mg via ORAL
  Filled 2013-10-08 (×2): qty 1

## 2013-10-08 SURGICAL SUPPLY — 61 items
CANISTER SUCTION 2500CC (MISCELLANEOUS) ×2 IMPLANT
CLOTH BEACON ORANGE TIMEOUT ST (SAFETY) ×2 IMPLANT
CLSR STERI-STRIP ANTIMIC 1/2X4 (GAUZE/BANDAGES/DRESSINGS) ×2 IMPLANT
CORDS BIPOLAR (ELECTRODE) ×2 IMPLANT
COVER PROBE W GEL 5X96 (DRAPES) ×1 IMPLANT
DRAPE C-ARM 42X72 X-RAY (DRAPES) ×2 IMPLANT
DRAPE INCISE IOBAN 85X60 (DRAPES) ×2 IMPLANT
DRAPE POUCH INSTRU U-SHP 10X18 (DRAPES) ×1 IMPLANT
DRAPE SURG 17X23 STRL (DRAPES) ×2 IMPLANT
DRAPE U-SHAPE 47X51 STRL (DRAPES) ×2 IMPLANT
DRSG MEPILEX BORDER 4X12 (GAUZE/BANDAGES/DRESSINGS) ×1 IMPLANT
DRSG MEPILEX BORDER 4X4 (GAUZE/BANDAGES/DRESSINGS) ×2 IMPLANT
DRSG MEPILEX BORDER 4X8 (GAUZE/BANDAGES/DRESSINGS) ×2 IMPLANT
DURAPREP 26ML APPLICATOR (WOUND CARE) ×2 IMPLANT
ELECT BLADE 4.0 EZ CLEAN MEGAD (MISCELLANEOUS) ×2
ELECT CAUTERY BLADE 6.4 (BLADE) ×2 IMPLANT
ELECT REM PT RETURN 9FT ADLT (ELECTROSURGICAL) ×2
ELECTRODE BLDE 4.0 EZ CLN MEGD (MISCELLANEOUS) IMPLANT
ELECTRODE REM PT RTRN 9FT ADLT (ELECTROSURGICAL) ×1 IMPLANT
GLOVE BIOGEL PI IND STRL 8 (GLOVE) ×1 IMPLANT
GLOVE BIOGEL PI IND STRL 8.5 (GLOVE) ×1 IMPLANT
GLOVE BIOGEL PI INDICATOR 8 (GLOVE) ×1
GLOVE BIOGEL PI INDICATOR 8.5 (GLOVE) ×1
GLOVE ECLIPSE 8.5 STRL (GLOVE) ×4 IMPLANT
GLOVE ORTHO TXT STRL SZ7.5 (GLOVE) ×2 IMPLANT
GOWN PREVENTION PLUS XXLARGE (GOWN DISPOSABLE) ×2 IMPLANT
GOWN STRL REIN 2XL XLG LVL4 (GOWN DISPOSABLE) ×2 IMPLANT
GOWN STRL REIN XL XLG (GOWN DISPOSABLE) ×4 IMPLANT
KIT BASIN OR (CUSTOM PROCEDURE TRAY) ×2 IMPLANT
KIT ROOM TURNOVER OR (KITS) ×2 IMPLANT
LAMI NARROW PRIPOLE 16CH (Orthopedic Implant) ×1 IMPLANT
NDL SPNL 18GX3.5 QUINCKE PK (NEEDLE) ×3 IMPLANT
NDL SUT 6 .5 CRC .975X.05 MAYO (NEEDLE) ×1 IMPLANT
NEEDLE 22X1 1/2 (OR ONLY) (NEEDLE) ×2 IMPLANT
NEEDLE MAYO TAPER (NEEDLE) ×2
NEEDLE SPNL 18GX3.5 QUINCKE PK (NEEDLE) ×6 IMPLANT
NS IRRIG 1000ML POUR BTL (IV SOLUTION) ×2 IMPLANT
PACK LAMINECTOMY ORTHO (CUSTOM PROCEDURE TRAY) ×2 IMPLANT
PACK UNIVERSAL I (CUSTOM PROCEDURE TRAY) ×2 IMPLANT
PAD ARMBOARD 7.5X6 YLW CONV (MISCELLANEOUS) ×4 IMPLANT
PROGRAMMER PATIENT (MISCELLANEOUS) ×1 IMPLANT
Protege IPG 16-channel rechargeable (Spine Construct) ×1 IMPLANT
SPONGE LAP 4X18 X RAY DECT (DISPOSABLE) IMPLANT
SPONGE SURGIFOAM ABS GEL 100 (HEMOSTASIS) ×1 IMPLANT
STAPLER VISISTAT 35W (STAPLE) ×2 IMPLANT
STIMULATOR IPG PROTEGE SPINAL (Stimulator) ×1 IMPLANT
SURGIFLO TRUKIT (HEMOSTASIS) IMPLANT
SUT FIBERWIRE #2 38 REV NDL BL (SUTURE) ×2
SUT MON AB 3-0 SH 27 (SUTURE) ×4
SUT MON AB 3-0 SH27 (SUTURE) ×2 IMPLANT
SUT VIC AB 1 CT1 27 (SUTURE) ×6
SUT VIC AB 1 CT1 27XBRD ANBCTR (SUTURE) ×3 IMPLANT
SUT VIC AB 2-0 CT1 18 (SUTURE) ×3 IMPLANT
SUTURE FIBERWR#2 38 REV NDL BL (SUTURE) ×1 IMPLANT
SYR BULB IRRIGATION 50ML (SYRINGE) ×2 IMPLANT
SYR CONTROL 10ML LL (SYRINGE) ×2 IMPLANT
SYSTEM CHARGING PRODIGY (MISCELLANEOUS) ×1 IMPLANT
TOWEL OR 17X24 6PK STRL BLUE (TOWEL DISPOSABLE) ×2 IMPLANT
TOWEL OR 17X26 10 PK STRL BLUE (TOWEL DISPOSABLE) ×2 IMPLANT
TRAY FOLEY CATH 16FRSI W/METER (SET/KITS/TRAYS/PACK) IMPLANT
WATER STERILE IRR 1000ML POUR (IV SOLUTION) ×2 IMPLANT

## 2013-10-08 NOTE — Anesthesia Postprocedure Evaluation (Signed)
  Anesthesia Post-op Note  Patient: Carolyn Allen  Procedure(s) Performed: Procedure(s): LUMBAR SPINAL CORD STIMULATOR INSERTION (N/A)  Patient Location: PACU  Anesthesia Type:General  Level of Consciousness: awake  Airway and Oxygen Therapy: Patient Spontanous Breathing  Post-op Pain: mild  Post-op Assessment: Post-op Vital signs reviewed  Post-op Vital Signs: stable  Complications: No apparent anesthesia complications

## 2013-10-08 NOTE — Anesthesia Procedure Notes (Signed)
Procedure Name: Intubation Date/Time: 10/08/2013 12:27 PM Performed by: Gwenyth Allegra Pre-anesthesia Checklist: Patient identified, Timeout performed, Emergency Drugs available, Suction available and Patient being monitored Patient Re-evaluated:Patient Re-evaluated prior to inductionOxygen Delivery Method: Circle system utilized Preoxygenation: Pre-oxygenation with 100% oxygen Intubation Type: IV induction Ventilation: Mask ventilation without difficulty Laryngoscope Size: Mac and 4 Grade View: Grade I Tube type: Oral Tube size: 7.5 mm Number of attempts: 1 Secured at: 22 cm Tube secured with: Tape Dental Injury: Teeth and Oropharynx as per pre-operative assessment

## 2013-10-08 NOTE — Brief Op Note (Signed)
10/08/2013  2:33 PM  PATIENT:  Carolyn Allen  47 y.o. female  PRE-OPERATIVE DIAGNOSIS:  chronic pain  POST-OPERATIVE DIAGNOSIS:  chronic pain  PROCEDURE:  Procedure(s): LUMBAR SPINAL CORD STIMULATOR INSERTION (N/A)  SURGEON:  Surgeon(s) and Role:    * Venita Lick, MD - Primary  PHYSICIAN ASSISTANT:   ASSISTANTS: none   ANESTHESIA:   general  EBL:  Total I/O In: 1400 [I.V.:1400] Out: 75 [Blood:75]  BLOOD ADMINISTERED:none  DRAINS: none   LOCAL MEDICATIONS USED:  MARCAINE     SPECIMEN:  No Specimen and Excision  DISPOSITION OF SPECIMEN:  N/A  COUNTS:  YES  TOURNIQUET:  * No tourniquets in log *  DICTATION: .Other Dictation: Dictation Number [id number]  PLAN OF CARE: Admit for overnight observation  PATIENT DISPOSITION:  PACU - hemodynamically stable.

## 2013-10-08 NOTE — Transfer of Care (Signed)
Immediate Anesthesia Transfer of Care Note  Patient: Carolyn Allen  Procedure(s) Performed: Procedure(s): LUMBAR SPINAL CORD STIMULATOR INSERTION (N/A)  Patient Location: PACU  Anesthesia Type:General  Level of Consciousness: awake and alert   Airway & Oxygen Therapy: Patient Spontanous Breathing and Patient connected to nasal cannula oxygen  Post-op Assessment: Report given to PACU RN and Post -op Vital signs reviewed and stable  Post vital signs: Reviewed and stable  Complications: No apparent anesthesia complications

## 2013-10-08 NOTE — Preoperative (Signed)
Beta Blockers   Reason not to administer Beta Blockers:Not Applicable 

## 2013-10-08 NOTE — Anesthesia Preprocedure Evaluation (Signed)
Anesthesia Evaluation  Patient identified by MRN, date of birth, ID band  Reviewed: Allergy & Precautions, H&P , NPO status   History of Anesthesia Complications (+) PONV  Airway Mallampati: II  Neck ROM: Full    Dental  (+) Poor Dentition Single carious tooth upper front:   Pulmonary neg pulmonary ROS,  + rhonchi         Cardiovascular hypertension, Rhythm:Regular Rate:Normal     Neuro/Psych  Headaches, Depression    GI/Hepatic negative GI ROS, Neg liver ROS,   Endo/Other  negative endocrine ROS  Renal/GU negative Renal ROS     Musculoskeletal  (+) Arthritis -, Fibromyalgia -  Abdominal   Peds  Hematology   Anesthesia Other Findings   Reproductive/Obstetrics                           Anesthesia Physical Anesthesia Plan  ASA: II  Anesthesia Plan: General   Post-op Pain Management:    Induction: Intravenous  Airway Management Planned: Oral ETT  Additional Equipment:   Intra-op Plan:   Post-operative Plan: Extubation in OR  Informed Consent: I have reviewed the patients History and Physical, chart, labs and discussed the procedure including the risks, benefits and alternatives for the proposed anesthesia with the patient or authorized representative who has indicated his/her understanding and acceptance.   Dental advisory given  Plan Discussed with: CRNA and Surgeon  Anesthesia Plan Comments:         Anesthesia Quick Evaluation

## 2013-10-08 NOTE — H&P (Signed)
H+P reviewed Plan on SCS Placement NO change in clinical exam

## 2013-10-09 ENCOUNTER — Encounter (HOSPITAL_COMMUNITY): Payer: Self-pay | Admitting: Orthopedic Surgery

## 2013-10-09 MED ORDER — POLYETHYLENE GLYCOL 3350 17 GM/SCOOP PO POWD: 17.0000 g | Freq: Every day | ORAL | Status: DC

## 2013-10-09 MED ORDER — OXYCODONE-ACETAMINOPHEN 10-325 MG PO TABS: 1.0000 | ORAL_TABLET | ORAL | Status: DC | PRN

## 2013-10-09 MED ORDER — DOCUSATE SODIUM 100 MG PO CAPS: 100.0000 mg | ORAL_CAPSULE | Freq: Three times a day (TID) | ORAL | Status: DC | PRN

## 2013-10-09 MED ORDER — METHOCARBAMOL 500 MG PO TABS: 500.0000 mg | ORAL_TABLET | Freq: Three times a day (TID) | ORAL | Status: DC | PRN

## 2013-10-09 MED ORDER — ONDANSETRON HCL 4 MG PO TABS: 4.0000 mg | ORAL_TABLET | Freq: Three times a day (TID) | ORAL | Status: DC | PRN

## 2013-10-09 NOTE — Progress Notes (Signed)
    Subjective: Procedure(s) (LRB): LUMBAR SPINAL CORD STIMULATOR INSERTION (N/A) 1 Day Post-Op  Patient reports pain as 3 on 0-10 scale.  Reports decreased leg pain reports incisional back pain   Positive void Negative bowel movement Positive flatus Negative chest pain or shortness of breath  Objective: Vital signs in last 24 hours: Temp:  [97.7 F (36.5 C)-99.9 F (37.7 C)] 99.4 F (37.4 C) (10/28 0631) Pulse Rate:  [63-106] 101 (10/28 0631) Resp:  [9-24] 16 (10/28 0631) BP: (119-183)/(70-102) 131/91 mmHg (10/28 0631) SpO2:  [92 %-100 %] 93 % (10/28 0631)  Intake/Output from previous day: 10/27 0701 - 10/28 0700 In: 2585 [P.O.:360; I.V.:2225] Out: 875 [Urine:800; Blood:75]  Labs: No results found for this basename: WBC, RBC, HCT, PLT,  in the last 72 hours No results found for this basename: NA, K, CL, CO2, BUN, CREATININE, GLUCOSE, CALCIUM,  in the last 72 hours No results found for this basename: LABPT, INR,  in the last 72 hours  Physical Exam: Neurologically intact ABD soft Neurovascular intact Intact pulses distally Incision: dressing C/D/I and no drainage Compartment soft  Assessment/Plan: Patient stable  xrays n/a Continue mobilization with physical therapy Continue care  Advance diet Up with therapy D/C IV fluids Ok for d/c to home after mobilization with PT  Venita Lick, MD [facility] Orthopaedics [phone]

## 2013-10-09 NOTE — Evaluation (Signed)
Physical Therapy Evaluation Patient Details Name: Carolyn Allen MRN: [id number] DOB: [birth year] Today's Date: 10/09/2013 Time: 4098-1191 PT Time Calculation (min): 31 min  PT Assessment / Plan / Recommendation History of Present Illness  Admitted for spinal cord stimulator  Clinical Impression  Patient evaluated by Physical Therapy with no further acute PT needs identified. All education has been completed and the patient has no further questions.  See below for any follow-up Physial Therapy or equipment needs. PT is signing off. Thank you for this referral.     PT Assessment  All further PT needs can be met in the next venue of care    Follow Up Recommendations  Outpatient PT The potential need for Outpatient PT can be addressed at Ortho follow-up appointments.     Does the patient have the potential to tolerate intense rehabilitation      Barriers to Discharge        Equipment Recommendations  None recommended by PT    Recommendations for Other Services     Frequency      Precautions / Restrictions Precautions Precautions: Back (for comfort) Precaution Comments: Pt well-versed in back prec Required Braces or Orthoses: Spinal Brace Spinal Brace: Applied in sitting position   Pertinent Vitals/Pain 7/10 back pain; Reports is much better than last night      Mobility  Bed Mobility Bed Mobility: Supine to Sit Supine to Sit: 6: Modified independent (Device/Increase time) Details for Bed Mobility Assistance: got up via sidelying position with log roll Transfers Transfers: Sit to Stand;Stand to Sit Sit to Stand: 7: Independent Stand to Sit: 7: Independent Ambulation/Gait Ambulation/Gait Assistance: 6: Modified independent (Device/Increase time) Ambulation Distance (Feet): 600 Feet Assistive device: None Ambulation/Gait Assistance Details: WFL pattern; just walking slowly Gait Pattern: Within Functional Limits    Exercises     PT Diagnosis: Acute pain  PT  Problem List: Pain;Decreased activity tolerance PT Treatment Interventions:       PT Goals(Current goals can be found in the care plan section) Acute Rehab PT Goals Patient Stated Goal: less pain PT Goal Formulation: No goals set, d/c therapy  Visit Information  Last PT Received On: 10/09/13 Assistance Needed: +1 History of Present Illness: Admitted for spinal cord stimulator       Prior Functioning  Home Living Family/patient expects to be discharged to:: Private residence Living Arrangements: Children Available Help at Discharge: Family;Friend(s);Available PRN/intermittently Type of Home: House Home Access: Level entry Home Layout: One level Home Equipment: Walker - 2 wheels Prior Function Level of Independence: Independent Communication Communication: No difficulties    Cognition  Cognition Arousal/Alertness: Awake/alert Behavior During Therapy: WFL for tasks assessed/performed Overall Cognitive Status: Within Functional Limits for tasks assessed    Extremity/Trunk Assessment Upper Extremity Assessment Upper Extremity Assessment: Overall WFL for tasks assessed Lower Extremity Assessment Lower Extremity Assessment: Overall WFL for tasks assessed   Balance    End of Session    GP Functional Assessment Tool Used: Clinical Judgement Functional Limitation: Mobility: Walking and moving around Mobility: Walking and Moving Around Current Status (Y7829): 0 percent impaired, limited or restricted Mobility: Walking and Moving Around Goal Status (F6213): 0 percent impaired, limited or restricted Mobility: Walking and Moving Around Discharge Status [phone]): 0 percent impaired, limited or restricted   Van Clines [facility] [place], [place] [phone]  10/09/2013, 11:14 AM

## 2013-10-09 NOTE — Op Note (Signed)
[name], Allen NO.:  [id number]  MEDICAL RECORD NO.:  [id number]  LOCATION:  [id number]                        FACILITY:  [facility]  PHYSICIAN:  Alvy Beal, MD    DATE OF BIRTH:  1966-04-23  DATE OF PROCEDURE:  10/08/2013 DATE OF DISCHARGE:                              OPERATIVE REPORT   PREOPERATIVE DIAGNOSIS:  Chronic Allen pain, failed Allen syndrome.  POSTOPERATIVE DIAGNOSIS:  Chronic Allen pain, failed Allen syndrome.  OPERATIVE PROCEDURE:  Implantation of spinal cord stimulator.  COMPLICATIONS:  None.  CONDITION:  Stable.  This is a Tripole lead from [place]. Jude with the right battery placement.  Carolyn Allen, Carolyn Allen, Carolyn bilateral leg pain for sometime now.  She had a previous two-level fusion at L4-L5 L5-S1 in the past, Carolyn unfortunately continued to have severe pain.  As a result of the positive trial, we elected to proceed with permanent implantation.  All appropriate risks, benefits, Carolyn alternatives were discussed with the patient, Carolyn consent was obtained.  OPERATIVE NOTE:  The patient was brought into the operating room, placed supine on the operating table.  After successful induction of general anesthesia Carolyn endotracheal intubation, TEDs were placed.  The patient was turned prone onto the Wilson frame.  All bony prominences were well padded.  Allen was prepped Carolyn draped in the standard fashion.  It should be noted in the holding area, I did map out the incision site Carolyn the battery placement at the patient's request.  Time-out was done confirming patient, procedure, Carolyn all other pertinent important data. Once this was completed, I infiltrated the proposed incision site. Using two 18-gauge needles, I started counting from the L4-L5 level up to the T9 level.  I confirmed proper positioning in both the AP Carolyn lateral planes.  An incision was made starting at the superior aspect  of the T9 Carolyn proceeding to the inferior aspect of T10.  Sharp dissection was carried out down to Carolyn through the deep fascia.  Deep fascia was split Carolyn I exposed the spinous process of T9 Carolyn T10.  Once I had the posterior aspect of the thoracic spine exposed, I went Allen Carolyn checked my leveling with both AP Carolyn lateral fluoroscopy.  Again counting up from L4-L5 level, I confirmed the T9 pedicle.  Once I confirmed the T9 pedicle, I removed bulk of the T9 spinous process.  I then performed degenerative laminotomy at T9 using a 2-mm Kerrison rongeur.  I then exploited the central raphe Carolyn released the ligamentum flavum Carolyn exposed the underlying epidural fat.  Using a [facility], I dissected through the epidural fat Carolyn exposed the posterior thecal sac. I then used my soft dural spatula Carolyn passed it up superiorly without any resistance.  I then obtained the actual implant Carolyn passed it so it was in the midline.  The patient was attached primarily at the T8 vertebral body.  I made sure that the proper lead was just at the inferior aspect of T7 vertebral body, Carolyn the most inferior portion the lead was below the T9-T10 disk space.  According to the rep, this was properly  positioned, Carolyn this was exactly where she was programming to obtain the pain relief.  I then dissected in the interspinous ligament between T10 Carolyn T11 Carolyn using FiberWire, sutured the leads directly to the T10 spinous process.  I then looped the T10, the leads between the T10 Carolyn T11 spinous process Carolyn closed the fascia.  I then created the right-sided battery wound, Carolyn then create a cavity 2.5 cm deep.  I then used the submuscular passing device Carolyn passed the wires from the thoracic wound to the right battery site incision.  I irrigated this thoracic wound copiously with normal saline.  I then made sure I had hemostasis using bipolar electrocautery.  I then closed the deep fascia with interrupted #1 Vicryl  sutures, Carolyn a 2-0 Vicryl sutures for the superficial tissue Carolyn 3-0 Monocryl.  I then turned my attention to the battery site.  The leads were connected to the battery Carolyn torqued down appropriately.  I then wrapped the excess amount underneath the battery Carolyn placed it into the battery cavity.  I sutured it down to the deep fascia with #1 Vicryl suture.  I then tested the battery directly, Carolyn all leads were working appropriately.  At this point, I irrigated the wound copiously with normal saline, Carolyn closed the remaining fashion with interrupted #1 Vicryl sutures, superficial with 2-0 Vicryl sutures Carolyn a 3-0 Monocryl for the skin.  Steri-Strips Carolyn a dry dressing were applied.  The patient was ultimately extubated Carolyn transferred to the PACU without incident.  At the end of the case, all needle Carolyn sponge counts were correct.  There was no adverse intraoperative events.     Alvy Beal, MD     DDB/MEDQ  D:  10/08/2013  T:  10/09/2013  Job:  [id number]

## 2013-10-09 NOTE — Progress Notes (Signed)
Clinical Social Worker will sign off for now as social work intervention is no longer needed. Please consult [place] again if new need arises.   Sabino Niemann, MSW [phone]

## 2013-10-09 NOTE — Progress Notes (Signed)
10/09/13 Per PT eval, recommended outpatient PT and no equipment needs identified. Jacquelynn Cree RN, BSN, CCM

## 2013-10-09 NOTE — Discharge Summary (Signed)
Patient ID: Carolyn Allen MRN: [id number] DOB/AGE: 1966-03-10 47 y.o.  Admit date: 10/08/2013 Discharge date: 10/09/2013  Admission Diagnoses:  Active Problems:   * No active hospital problems. *   Discharge Diagnoses:  Active Problems:   * No active hospital problems. *  status post Procedure(s): LUMBAR SPINAL CORD STIMULATOR INSERTION  Past Medical History  Diagnosis Date  . Depression   . Hypertension   . HSV (herpes simplex virus) infection   . Migraines   . Mitral valve prolapse   . Back pain   . Allergy   . Arthritis   . PONV (postoperative nausea and vomiting)   . TMJ (temporomandibular joint syndrome)     Surgeries: Procedure(s): LUMBAR SPINAL CORD STIMULATOR INSERTION on 10/08/2013   Consultants:    Discharged Condition: Improved  Hospital Course: Carolyn Allen is an 47 y.o. female who was admitted 10/08/2013 for operative treatment of <principal problem not specified>. Patient failed conservative treatments (please see the history and physical for the specifics) and had severe unremitting pain that affects sleep, daily activities and work/hobbies. After pre-op clearance, the patient was taken to the operating room on 10/08/2013 and underwent  Procedure(s): LUMBAR SPINAL CORD STIMULATOR INSERTION.    Patient was given perioperative antibiotics: Anti-infectives   Start     Dose/Rate Route Frequency Ordered Stop   10/08/13 2100  ceFAZolin (ANCEF) IVPB 1 g/50 mL premix     1 g 100 mL/hr over 30 Minutes Intravenous Every 8 hours 10/08/13 1630 10/09/13 0721   10/07/13 1622  ceFAZolin (ANCEF) IVPB 2 g/50 mL premix     2 g 100 mL/hr over 30 Minutes Intravenous 30 min pre-op 10/07/13 1622 10/08/13 1241       Patient was given sequential compression devices and early ambulation to prevent DVT.   Patient benefited maximally from hospital stay and there were no complications. At the time of discharge, the patient was urinating/moving their bowels  without difficulty, tolerating a regular diet, pain is controlled with oral pain medications and they have been cleared by PT/OT.   Recent vital signs: Patient Vitals for the past 24 hrs:  BP Temp Temp src Pulse Resp SpO2  10/09/13 0631 131/91 mmHg 99.4 F (37.4 C) Oral 101 16 93 %  10/09/13 0321 119/70 mmHg 99.9 F (37.7 C) Oral 95 16 92 %  10/08/13 2255 152/87 mmHg 99.5 F (37.5 C) Oral 104 20 97 %  10/08/13 2027 163/102 mmHg 99.6 F (37.6 C) Oral 106 24 100 %  10/08/13 1900 161/90 mmHg - - - - -  10/08/13 1627 150/91 mmHg 97.7 F (36.5 C) - - 16 -  10/08/13 1600 - 98 F (36.7 C) - - - -  10/08/13 1557 137/80 mmHg - - 63 11 100 %  10/08/13 1542 141/86 mmHg - - 66 17 100 %  10/08/13 1527 141/95 mmHg - - 63 11 100 %  10/08/13 1512 151/92 mmHg - - 65 15 96 %  10/08/13 1457 148/81 mmHg - - 70 9 92 %  10/08/13 1442 154/92 mmHg - - 81 20 96 %  10/08/13 1440 - 97.8 F (36.6 C) - - - -  10/08/13 0943 183/99 mmHg 97.9 F (36.6 C) Oral 72 20 98 %     Recent laboratory studies: No results found for this basename: WBC, HGB, HCT, PLT, NA, K, CL, CO2, BUN, CREATININE, GLUCOSE, PT, INR, CALCIUM, 2,  in the last 72 hours   Discharge Medications:  Medication List    STOP taking these medications       cyclobenzaprine 10 MG tablet  Commonly known as:  FLEXERIL     HYDROcodone-acetaminophen 5-325 MG per tablet  Commonly known as:  NORCO/VICODIN     Loratadine 10 MG Caps     meloxicam 7.5 MG tablet  Commonly known as:  MOBIC     promethazine 25 MG tablet  Commonly known as:  PHENERGAN     traMADol 50 MG tablet  Commonly known as:  ULTRAM      TAKE these medications       ALPRAZolam 0.25 MG tablet  Commonly known as:  XANAX  Take 0.25 mg by mouth 3 (three) times daily as needed for anxiety.     ASCORBIC ACID ER PO  Take 1 tablet by mouth daily.     CALCIUM + D PO  Take 1 tablet by mouth daily.     dicyclomine 20 MG tablet  Commonly known as:  BENTYL  Take 1  tablet (20 mg total) by mouth every 6 (six) hours.     docusate sodium 100 MG capsule  Commonly known as:  COLACE  Take 1 capsule (100 mg total) by mouth 3 (three) times daily as needed for constipation.     lisinopril-hydrochlorothiazide 20-12.5 MG per tablet  Commonly known as:  ZESTORETIC  Take 1 tablet by mouth daily.     methocarbamol 500 MG tablet  Commonly known as:  ROBAXIN  Take 1 tablet (500 mg total) by mouth 3 (three) times daily as needed.     multivitamin with minerals tablet  Take 1 tablet by mouth daily.     ondansetron 4 MG tablet  Commonly known as:  ZOFRAN  Take 1 tablet (4 mg total) by mouth every 8 (eight) hours as needed for nausea.     oxyCODONE-acetaminophen 10-325 MG per tablet  Commonly known as:  PERCOCET  Take 1 tablet by mouth every 4 (four) hours as needed for pain.     PARoxetine 20 MG tablet  Commonly known as:  PAXIL  Take 1 tablet (20 mg total) by mouth every morning.     polyethylene glycol powder powder  Commonly known as:  GLYCOLAX  Take 17 g by mouth daily.     SUMAtriptan 100 MG tablet  Commonly known as:  IMITREX  Take 100 mg by mouth every 2 (two) hours as needed for migraine.        Diagnostic Studies: Dg Chest 2 View  10/03/2013   CLINICAL DATA:  Preop spinal cord stimulator  EXAM: CHEST  2 VIEW  COMPARISON:  07/03/2008  FINDINGS: Cardiomediastinal silhouette is stable. Mild thoracic dextroscoliosis. Minimal degenerative changes thoracic spine. No acute infiltrate or pleural effusion. No pulmonary edema.  IMPRESSION: No active disease. Mild thoracic dextroscoliosis.   Electronically Signed   By: Natasha Mead M.D.   On: 10/03/2013 10:01   Dg Thoracic Spine 2 View  10/08/2013   CLINICAL DATA:  Chronic back pain.  EXAM: THORACIC SPINE - 2 VIEW  COMPARISON:  Chest x-ray dated 10/03/2013  FLUOROSCOPY TIME:  FLUOROSCOPY TIME  0 min 30 seconds  FINDINGS: AP and lateral C-arm images demonstrate a spinal cord stimulator at the T8-9 level.   IMPRESSION: Spinal cord stimulator at T8-9.   Electronically Signed   By: Geanie Cooley M.D.   On: 10/08/2013 15:32          Follow-up Information   Follow up with Alvy Beal, MD.  Schedule an appointment as soon as possible for a visit in 2 weeks.   Specialty:  Orthopedic Surgery   Contact information:   [address] [place] [address] [phone]       Discharge Plan:  discharge to home  Disposition: stable    Signed: Venita Lick D for Dr. Venita Lick [facility] Orthopaedics [phone] 10/09/2013, 9:01 AM

## 2013-10-09 NOTE — Progress Notes (Signed)
UR COMPLETED  

## 2013-10-09 NOTE — Evaluation (Signed)
Occupational Therapy Evaluation Patient Details Name: Carolyn Allen MRN: [id number] DOB: 10-Mar-1966 Today's Date: 10/09/2013 Time: 4098-1191 OT Time Calculation (min): 30 min  OT Assessment / Plan / Recommendation History of present illness Admitted for spinal cord stimulator   Clinical Impression   Pt admitted for spinal cord stimulator implant. Stimulator not on during eval. Pt stated she wanted to know the difference between doing things with it on vs. Off. Pt up moving around room slowly w/o AD. Pt's dtr and dtr's boyfriend present. Pt states this is not her 1st back sx & able to recall back precautions and use of AE (she has reacher). Pt states MD ed her not to shower thus no shower chair or tub bench needed. Pt demo'd Mod (I) (i.e., increased time) to transfer on/off regular height commode using grab rail to assist sit>stand. Pt states she has vanity in same position as grab bar to use at home. No follow up indicated. Pt has necessary AE. No DME recommended for bathroom. Pt cleared for d/c home w/family.     OT Assessment  Patient does not need any further OT services    Follow Up Recommendations  No OT follow up;Supervision/Assistance - 24 hour;Supervision - Intermittent    Barriers to Discharge      Equipment Recommendations   (d/n not need 3n1; no shower per MD)    Recommendations for Other Services    Frequency       Precautions / Restrictions Precautions Precautions: Back Precaution Comments: Pt well-versed in back prec Required Braces or Orthoses: Spinal Brace Spinal Brace: Applied in sitting position Restrictions Weight Bearing Restrictions: No   Pertinent Vitals/Pain 7/10 "I have a high pain tolerance so that'd be higher for someone who doesn't"    ADL  Eating/Feeding: Independent Grooming: Independent Lower Body Dressing: Modified independent (w/inc time. Has reacher. Wears slip on shoes) Where Assessed - Lower Body Dressing: Unsupported sitting Toilet  Transfer: Supervision/safety (w/inc time; no 3n1) Toilet Transfer Method: Stand pivot Toilet Transfer Equipment: Regular height toilet Toileting - Clothing Manipulation and Hygiene: Simulated;Modified independent Where Assessed - Toileting Clothing Manipulation and Hygiene: Sit to stand from 3-in-1 or toilet Transfers/Ambulation Related to ADLs: slow, intentional movements. good safety. no AD ADL Comments: Pt able to perform ADLs w/inc time utilizing back prec.    OT Diagnosis:    OT Problem List:   OT Treatment Interventions:     OT Goals(Current goals can be found in the care plan section) Acute Rehab OT Goals Patient Stated Goal: go home w/less pain OT Goal Formulation: With patient/family Time For Goal Achievement: 10/09/13 Potential to Achieve Goals: Good  Visit Information  Last OT Received On: 10/09/13 Assistance Needed: +1 History of Present Illness: Admitted for spinal cord stimulator       Prior Functioning     Home Living Family/patient expects to be discharged to:: Private residence Living Arrangements: Children Available Help at Discharge: Family;Friend(s);Available PRN/intermittently Type of Home: House Home Access: Level entry Home Layout: One level Home Equipment: Walker - 2 wheels Additional Comments:  (pt had 3n1 previously but "threw it out"; d/n want one) Prior Function Level of Independence: Independent Communication Communication: No difficulties         Vision/Perception     Cognition  Cognition Arousal/Alertness: Awake/alert Behavior During Therapy: WFL for tasks assessed/performed Overall Cognitive Status: Within Functional Limits for tasks assessed    Extremity/Trunk Assessment Upper Extremity Assessment Upper Extremity Assessment: Overall WFL for tasks assessed Lower Extremity Assessment Lower Extremity  Assessment: Overall WFL for tasks assessed     Mobility Bed Mobility Bed Mobility: Supine to Sit Supine to Sit: 6: Modified  independent (Device/Increase time) Details for Bed Mobility Assistance: got up via sidelying position with log roll Transfers Sit to Stand: 6: Modified independent (Device/Increase time) Stand to Sit: 6: Modified independent (Device/Increase time)     Exercise     Balance     End of Session    GO Functional Limitation: Self care Self Care Current Status (Z6109): At least 1 percent but less than 20 percent impaired, limited or restricted Self Care Goal Status (U0454): At least 1 percent but less than 20 percent impaired, limited or restricted Self Care Discharge Status [id number]): At least 1 percent but less than 20 percent impaired, limited or restricted   Susannah Carbin, Deidre Ala 10/09/2013, 1:48 PM

## 2013-11-22 ENCOUNTER — Other Ambulatory Visit: Payer: Self-pay | Admitting: Orthopedic Surgery

## 2013-11-22 DIAGNOSIS — M961 Postlaminectomy syndrome, not elsewhere classified: Secondary | ICD-10-CM

## 2013-11-29 ENCOUNTER — Other Ambulatory Visit: Payer: Self-pay | Admitting: Orthopedic Surgery

## 2013-11-29 ENCOUNTER — Inpatient Hospital Stay
Admission: RE | Admit: 2013-11-29 | Discharge: 2013-11-29 | Disposition: A | Discharge status: Home / Self Care | Payer: Self-pay | Source: Ambulatory Visit | Attending: Orthopedic Surgery | Admitting: Orthopedic Surgery

## 2013-11-29 ENCOUNTER — Ambulatory Visit
Admission: RE | Admit: 2013-11-29 | Discharge: 2013-11-29 | Disposition: A | Discharge status: Home / Self Care | Payer: BC Managed Care – PPO | Source: Ambulatory Visit | Attending: Orthopedic Surgery | Admitting: Orthopedic Surgery

## 2013-11-29 VITALS — BP 141/79 | HR 61

## 2013-11-29 DIAGNOSIS — G8929 Other chronic pain: Secondary | ICD-10-CM

## 2013-11-29 DIAGNOSIS — M961 Postlaminectomy syndrome, not elsewhere classified: Secondary | ICD-10-CM

## 2013-11-29 DIAGNOSIS — M542 Cervicalgia: Secondary | ICD-10-CM

## 2013-11-29 MED ORDER — DIAZEPAM 5 MG PO TABS
10.0000 mg | ORAL_TABLET | Freq: Once | ORAL | Status: AC
  Administered 2013-11-29: 10 mg via ORAL

## 2013-11-29 MED ORDER — MEPERIDINE HCL 100 MG/ML IJ SOLN
100.0000 mg | Freq: Once | INTRAMUSCULAR | Status: AC
  Administered 2013-11-29: 100 mg via INTRAMUSCULAR

## 2013-11-29 MED ORDER — IOHEXOL 300 MG/ML  SOLN
10.0000 mL | Freq: Once | INTRAMUSCULAR | Status: AC | PRN
  Administered 2013-11-29: 10 mL via INTRATHECAL

## 2013-11-29 MED ORDER — ONDANSETRON HCL 4 MG/2ML IJ SOLN
4.0000 mg | Freq: Once | INTRAMUSCULAR | Status: AC
  Administered 2013-11-29: 4 mg via INTRAMUSCULAR

## 2013-11-29 NOTE — Progress Notes (Signed)
Patient states she has been off Paxil, Imitrex, Tramadol and Phenergan for at least the past two days.

## 2013-12-21 ENCOUNTER — Ambulatory Visit: Payer: BC Managed Care – PPO | Admitting: Internal Medicine

## 2013-12-21 VITALS — BP 144/90 | HR 72 | Temp 98.2°F | Resp 16 | Ht 67.0 in | Wt 220.0 lb

## 2013-12-21 DIAGNOSIS — K589 Irritable bowel syndrome without diarrhea: Secondary | ICD-10-CM

## 2013-12-21 DIAGNOSIS — F411 Generalized anxiety disorder: Secondary | ICD-10-CM

## 2013-12-21 DIAGNOSIS — M542 Cervicalgia: Secondary | ICD-10-CM

## 2013-12-21 DIAGNOSIS — M549 Dorsalgia, unspecified: Secondary | ICD-10-CM

## 2013-12-21 DIAGNOSIS — G8929 Other chronic pain: Secondary | ICD-10-CM

## 2013-12-21 DIAGNOSIS — I10 Essential (primary) hypertension: Secondary | ICD-10-CM

## 2013-12-21 DIAGNOSIS — F329 Major depressive disorder, single episode, unspecified: Secondary | ICD-10-CM

## 2013-12-21 DIAGNOSIS — M79609 Pain in unspecified limb: Secondary | ICD-10-CM

## 2013-12-21 DIAGNOSIS — F32A Depression, unspecified: Secondary | ICD-10-CM

## 2013-12-21 MED ORDER — METHOCARBAMOL 500 MG PO TABS: 500.0000 mg | ORAL_TABLET | Freq: Three times a day (TID) | ORAL | Status: DC | PRN

## 2013-12-21 MED ORDER — DICYCLOMINE HCL 20 MG PO TABS: 20.0000 mg | ORAL_TABLET | Freq: Four times a day (QID) | ORAL | Status: DC

## 2013-12-21 MED ORDER — ONDANSETRON HCL 4 MG PO TABS: 4.0000 mg | ORAL_TABLET | Freq: Three times a day (TID) | ORAL | Status: DC | PRN

## 2013-12-21 MED ORDER — TRAMADOL HCL 50 MG PO TABS: 50.0000 mg | ORAL_TABLET | Freq: Four times a day (QID) | ORAL | Status: DC | PRN

## 2013-12-21 MED ORDER — GABAPENTIN 300 MG PO CAPS: 300.0000 mg | ORAL_CAPSULE | Freq: Every day | ORAL | Status: DC

## 2013-12-21 MED ORDER — PAROXETINE HCL 20 MG PO TABS: 20.0000 mg | ORAL_TABLET | ORAL | Status: DC

## 2013-12-21 MED ORDER — LISINOPRIL-HYDROCHLOROTHIAZIDE 20-12.5 MG PO TABS: 1.0000 | ORAL_TABLET | Freq: Every day | ORAL | Status: DC

## 2013-12-21 MED ORDER — ALPRAZOLAM 0.25 MG PO TABS: 0.2500 mg | ORAL_TABLET | Freq: Three times a day (TID) | ORAL | Status: DC | PRN

## 2013-12-21 NOTE — Progress Notes (Signed)
Subjective:    Patient ID: Carolyn Allen, female    DOB: [birth year], 48 y.o.   MRN: [id number] This chart was scribed for Ellamae Sia, MD by Valera Castle, ED Scribe. This patient was seen in room 8 and the patient's care was started at 4:58 PM.  Chief Complaint  Patient presents with   Hand Pain    unable to lift some fingers, 1 hour, radiating up arm    HPI Carolyn Allen is a 48 y.o. female with h/o chronic neck and back pain, who presents to the [facility] complaining of right hand pain, that radiates up her right arm, with associated right fingers 4/5 numbness and immobility, onset earlier this afternoon around 2:20 PM when she was pulling on a cord on her weedeater which would not start. There was no known injury but suddenly her right hand was tremulous especially the last 3 fingers, she had some pain in her wrist and elbow, and she began to notice that she could not straighten her fourth and fifth fingers completely. No sensory loss. Feels she cant control their movement.   She reports being on Robaxin, and reports she has been going to pain management for her neck pain -Dr. Ethelene Hal referred her to Dr. Shon Baton. She just underwent electrical stimulator implantation for the lumbar area. This is helping. She reports having imaging done recently over her right neck, which showed deterioration of her neck.  She reports headache and nausea, with her h/o chronic neck pain. She states she has been on Phenergan, and is needing a refill of that, along with her other medications. She is requesting a Rx that complies with passing DOT exam. 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  She denies any other complaints. She needs refills of medication for irritable bowel syndrome, generalized anxiety disorder, depression, hypertension, and allergic rhinitis  PCP - DOOLITTLE, Harrel Lemon, MD  Patient Active Problem  List   Diagnosis Date Noted   HTN (hypertension) 04/19/2012   Migraine headache 04/19/2012   IBS (irritable bowel syndrome) 04/19/2012   GAD (generalized anxiety disorder) 04/19/2012   Depression 04/19/2012   AR (allergic rhinitis) 04/19/2012   Chronic back pain 04/19/2012   Chronic neck pain 04/19/2012    Review of Systems  Constitutional: Negative for unexpected weight change.  Respiratory: Negative for cough and shortness of breath.   Cardiovascular: Negative for chest pain, palpitations and leg swelling.  Gastrointestinal: Positive for nausea. Negative for vomiting, abdominal pain and abdominal distention.  Genitourinary: Negative for frequency and difficulty urinating.  Musculoskeletal: Positive for arthralgias (right hand, radiating up right arm), back pain and neck pain.  Neurological: Positive for headaches. Negative for weakness (right fingers). Numbness: right fingers.       Objective:   Physical Exam  Nursing note and vitals reviewed. Constitutional: She is oriented to person, place, and time. She appears well-developed and well-nourished. No distress.  HENT:  Head: Normocephalic and atraumatic.  Eyes: EOM are normal. Pupils are equal, round, and reactive to light.  Neck: Neck supple. No thyromegaly present.  Range of motion is fair although there is pain with maneuvers that impinged the right cervical trapezius area  Cardiovascular: Normal rate.   Pulmonary/Chest: Effort normal. No respiratory distress.  Musculoskeletal:  Straight leg raise negative to 90 bilaterally The right elbow has full range of motion with tenderness over the lateral epicondyle but no  swelling or redness The right wrist has a good range of motion with tenderness over the ulnar area but no swelling or redness The right fourth and fifth fingers lacks 15 of full extension and demonstrate mild tremulousness with certain movements Finger-thumb opposition however is intact handgrips are  equal bilaterally She has full flexion No sensory losses over these digits  Lymphadenopathy:    She has no cervical adenopathy.  Neurological: She is alert and oriented to person, place, and time.  Skin: Skin is warm and dry.  Psychiatric: She has a normal mood and affect. Her behavior is normal.    BP 144/90   Pulse 72   Temp(Src) 98.2 F (36.8 C)   Resp 16   Ht 5\' 7"  (1.702 m)   Wt 220 lb (99.791 kg)   BMI 34.45 kg/m2   SpO2 97%     Assessment & Plan:  Acute problems 1.  mild ulnar motor loss on the right fourth fifth fingers-suspect origin as the neck  Neurontin 300 at bedtime/recheck 2 weeks  Ongoing problems 1. HTN (hypertension)   2. IBS (irritable bowel syndrome)   3. Depression     Meds ordered this encounter  Medications   ondansetron (ZOFRAN) 4 MG tablet    Sig: Take 1 tablet (4 mg total) by mouth every 8 (eight) hours as needed for nausea.    Dispense:  20 tablet    Refill:  5   lisinopril-hydrochlorothiazide (ZESTORETIC) 20-12.5 MG per tablet    Sig: Take 1 tablet by mouth daily.    Dispense:  90 tablet    Refill:  3   dicyclomine (BENTYL) 20 MG tablet    Sig: Take 1 tablet (20 mg total) by mouth every 6 (six) hours.    Dispense:  120 tablet    Refill:  5    Order Specific Question:  Supervising Provider    Answer:  Ethelda ChickSMITH, KRISTI M [[id number]]   ALPRAZolam (XANAX) 0.25 MG tablet    Sig: Take 1 tablet (0.25 mg total) by mouth 3 (three) times daily as needed for anxiety.    Dispense:  90 tablet    Refill:  5   methocarbamol (ROBAXIN) 500 MG tablet    Sig: Take 1 tablet (500 mg total) by mouth 3 (three) times daily as needed.    Dispense:  90 tablet    Refill:  5   PARoxetine (PAXIL) 20 MG tablet    Sig: Take 1 tablet (20 mg total) by mouth every morning.    Dispense:  90 tablet    Refill:  3   traMADol (ULTRAM) 50 MG tablet    Sig: Take 1 tablet (50 mg total) by mouth every 6 (six) hours as needed.    Dispense:  60 tablet    Refill:  5    gabapentin (NEURONTIN) 300 MG capsule    Sig: Take 1 capsule (300 mg total) by mouth at bedtime.    Dispense:  14 capsule    Refill:  0         I have completed the patient encounter in its entirety as documented by the scribe, with editing by me where necessary. Robert P. Merla Richesoolittle, M.D.

## 2014-01-08 ENCOUNTER — Ambulatory Visit (INDEPENDENT_AMBULATORY_CARE_PROVIDER_SITE_OTHER): Payer: BC Managed Care – PPO | Admitting: General Surgery

## 2014-01-08 ENCOUNTER — Encounter (INDEPENDENT_AMBULATORY_CARE_PROVIDER_SITE_OTHER): Payer: Self-pay | Admitting: General Surgery

## 2014-01-08 VITALS — BP 130/80 | HR 68 | Temp 98.6°F | Resp 14 | Ht 67.5 in | Wt 216.0 lb

## 2014-01-08 DIAGNOSIS — K5900 Constipation, unspecified: Secondary | ICD-10-CM | POA: Insufficient documentation

## 2014-01-08 NOTE — Progress Notes (Signed)
Patient ID: Carolyn DeistChristine M Shrewsberry, female   DOB: 03-Aug-1966, 48 y.o.   MRN: [id number]  Chief Complaint  Patient presents with  . New Evaluation    eval rectal sphinter disfuntion    HPI Carolyn DeistChristine M Eberlein is a 48 y.o. female.  We're asked to see the patient in consultation by Dr. Chevis PrettyMezer to evaluate her for sphincter dysfunction. The patient is a [age] year old white female who presents with complaints of difficulty having bowel movements. It sounds as though she vacillates between constipation and diarrhea. She frequently will go 3 days or so between bowel movements. She states that she frequently has to reach up there and pull the stool out. She denies any fevers or chills. She denies any weight loss. She also notes occasional abdominal bloating. She denies any nausea or vomiting. HPI  Past Medical History  Diagnosis Date  . Depression   . Hypertension   . HSV (herpes simplex virus) infection   . Migraines   . Mitral valve prolapse   . Back pain   . Allergy   . Arthritis   . PONV (postoperative nausea and vomiting)   . TMJ (temporomandibular joint syndrome)   . Heart murmur     Past Surgical History  Procedure Laterality Date  . Abdominal hysterectomy    . Spine surgery    . Cystectomy    . Shoulder surgery    . Joint replacement      both knees  . Tmj arthroplasty    . Spinal cord stimulator insertion N/A 10/08/2013    Procedure: LUMBAR SPINAL CORD STIMULATOR INSERTION;  Surgeon: Venita Lickahari Brooks, MD;  Location: [facility] OR;  Service: Orthopedics;  Laterality: N/A;    Family History  Problem Relation Age of Onset  . Cancer Mother     colon and lung  . Cancer Father     lung  . Heart disease Father   . Heart disease Sister   . Hypertension Sister   . Diabetes Sister   . Mental illness Sister   . Fibromyalgia Sister     Social History History  Substance Use Topics  . Smoking status: Never Smoker   . Smokeless tobacco: Never Used  . Alcohol Use: No    Allergies  Allergen  Reactions  . Oxycodone Diarrhea and Nausea And Vomiting  . Codeine Other (See Comments)    Causes numbness on one side of body    Current Outpatient Prescriptions  Medication Sig Dispense Refill  . ALPRAZolam (XANAX) 0.25 MG tablet Take 1 tablet (0.25 mg total) by mouth 3 (three) times daily as needed for anxiety.  90 tablet  5  . Calcium Carbonate-Vitamin D (CALCIUM + D PO) Take 1 tablet by mouth daily.      Marland Kitchen. dicyclomine (BENTYL) 20 MG tablet Take 1 tablet (20 mg total) by mouth every 6 (six) hours.  120 tablet  5  . lisinopril-hydrochlorothiazide (ZESTORETIC) 20-12.5 MG per tablet Take 1 tablet by mouth daily.  90 tablet  3  . meloxicam (MOBIC) 7.5 MG tablet       . methocarbamol (ROBAXIN) 500 MG tablet Take 1 tablet (500 mg total) by mouth 3 (three) times daily as needed.  90 tablet  5  . Multiple Vitamins-Minerals (MULTIVITAMIN WITH MINERALS) tablet Take 1 tablet by mouth daily.      . ondansetron (ZOFRAN) 4 MG tablet Take 1 tablet (4 mg total) by mouth every 8 (eight) hours as needed for nausea.  20 tablet  5  . PARoxetine (  PAXIL) 20 MG tablet Take 1 tablet (20 mg total) by mouth every morning.  90 tablet  3  . SUMAtriptan (IMITREX) 100 MG tablet Take 100 mg by mouth every 2 (two) hours as needed for migraine.      . traMADol (ULTRAM) 50 MG tablet Take 1 tablet (50 mg total) by mouth every 6 (six) hours as needed.  60 tablet  5  . gabapentin (NEURONTIN) 300 MG capsule Take 1 capsule (300 mg total) by mouth at bedtime.  14 capsule  0   No current facility-administered medications for this visit.    Review of Systems Review of Systems  Constitutional: Negative.   HENT: Negative.   Eyes: Negative.   Respiratory: Negative.   Cardiovascular: Negative.   Gastrointestinal: Positive for diarrhea, constipation and abdominal distention.  Endocrine: Negative.   Genitourinary: Negative.   Musculoskeletal: Negative.   Skin: Negative.   Allergic/Immunologic: Negative.   Neurological:  Negative.   Hematological: Negative.   Psychiatric/Behavioral: Negative.     Blood pressure 130/80, pulse 68, temperature 98.6 F (37 C), temperature source Temporal, resp. rate 14, height 5' 7.5" (1.715 m), weight 216 lb (97.977 kg).  Physical Exam Physical Exam  Constitutional: She is oriented to person, place, and time. She appears well-developed and well-nourished.  HENT:  Head: Normocephalic and atraumatic.  Eyes: Conjunctivae and EOM are normal. Pupils are equal, round, and reactive to light.  Neck: Normal range of motion. Neck supple.  Cardiovascular: Normal rate, regular rhythm and normal heart sounds.   Pulmonary/Chest: Effort normal and breath sounds normal.  Abdominal: Soft. Bowel sounds are normal. There is no tenderness.  Genitourinary:  On rectal exam she has 3 small external hemorrhoidal skin tags that are not acute appearing. On digital exam she has decreased rectal tone and no palpable mass. On anoscopic exam she has minimal internal hemorrhoidal tissue and no evidence of mass. The mucosa looks normal.  Musculoskeletal: Normal range of motion.  Lymphadenopathy:    She has no cervical adenopathy.  Neurological: She is alert and oriented to person, place, and time.  Skin: Skin is warm and dry.  Psychiatric: She has a normal mood and affect. Her behavior is normal.    Data Reviewed As above  Assessment    The patient appears to have problems with constipation. This may be exacerbated by her history of back problems and the medicines that she has to take for this. I see no evidence of obstruction of the rectum today. She also has poor rectal tone which may also go along with a muscular or motility issue     Plan    At this point there is nothing that I would recommend surgically to try to improve her constipation. I do think she needs to be evaluated by a colorectal specialist. We will make her referral to a colorectal specialist at [facility] and we  will plan to see her back on a when necessary basis        TOTH III,Raynor Calcaterra S 01/08/2014, 9:36 AM

## 2014-01-08 NOTE — Patient Instructions (Signed)
Will send to colorectal specialist

## 2014-01-09 ENCOUNTER — Telehealth (INDEPENDENT_AMBULATORY_CARE_PROVIDER_SITE_OTHER): Payer: Self-pay | Admitting: *Deleted

## 2014-01-09 NOTE — Telephone Encounter (Signed)
I spoke with Carolyn Allen and informed her of the appt with Dr[facility] By[facility]/12 @ 2:00pm.  I informed her that they would be mailing her a packet with directions and parking instructions.  She is agreeable with this appt.

## 2014-01-24 DIAGNOSIS — R198 Other specified symptoms and signs involving the digestive system and abdomen: Secondary | ICD-10-CM | POA: Insufficient documentation

## 2014-01-24 DIAGNOSIS — R159 Full incontinence of feces: Secondary | ICD-10-CM | POA: Insufficient documentation

## 2014-02-21 DIAGNOSIS — M6289 Other specified disorders of muscle: Secondary | ICD-10-CM | POA: Insufficient documentation

## 2014-05-06 ENCOUNTER — Other Ambulatory Visit: Payer: Self-pay | Admitting: Internal Medicine

## 2014-05-07 NOTE — Telephone Encounter (Signed)
Dr Merla Riches, you have Rxd this for pt in past but don't see it on her med list at your last OV. Do you still want her taking it?

## 2014-07-02 ENCOUNTER — Other Ambulatory Visit: Payer: Self-pay | Admitting: Internal Medicine

## 2014-08-18 ENCOUNTER — Other Ambulatory Visit: Payer: Self-pay | Admitting: Internal Medicine

## 2014-08-20 ENCOUNTER — Ambulatory Visit (INDEPENDENT_AMBULATORY_CARE_PROVIDER_SITE_OTHER): Payer: Self-pay | Admitting: Emergency Medicine

## 2014-08-20 VITALS — BP 114/90 | HR 87 | Temp 98.6°F | Resp 16 | Ht 67.0 in | Wt 211.2 lb

## 2014-08-20 DIAGNOSIS — Z0289 Encounter for other administrative examinations: Secondary | ICD-10-CM

## 2014-08-20 DIAGNOSIS — Z0000 Encounter for general adult medical examination without abnormal findings: Secondary | ICD-10-CM

## 2014-08-20 NOTE — Progress Notes (Signed)
Subjective:  This chart was scribed for Carolyn Chris, MD by Carolyn Allen, Medical Scribe. This patient was seen in Room 26 and the patient's care was started at 3:06 P.   Patient ID: Carolyn Allen, female    DOB: 1966/07/06, 48 y.o.   MRN: [id number]  HPI HPI Comments: Carolyn Allen is a 48 y.o. 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  She incurred a workman's compensation injury in December of 2010 that left her with chronic back pain.  She stopped receiving workman's compensation in July of 2014 in order to get surgery to treat her chronic back pain.  She had a spinal cord stimulator placed in October 2014 by Dr. Shon Allen.  She also suffered a neck injury in 2012 and is able to manage the pain herself.  She has a history of back fusion.  She only takes pain medications as needed due to their side effects.  Her PCP is Dr. Merla Allen and she sees him as needed.  Past Medical History  Diagnosis Date  . Depression   . Hypertension   . HSV (herpes simplex virus) infection   . Migraines   . Mitral valve prolapse   . Back pain   . Allergy   . Arthritis   . PONV (postoperative nausea and vomiting)   . TMJ (temporomandibular joint syndrome)   . Heart murmur    Past Surgical History  Procedure Laterality Date  . Abdominal hysterectomy    . Spine surgery    . Cystectomy    . Shoulder surgery    . Joint replacement      both knees  . Tmj arthroplasty    . Spinal cord stimulator insertion N/A 10/08/2013    Procedure: LUMBAR SPINAL CORD STIMULATOR INSERTION;  Surgeon: Carolyn Lick, MD;  Location: [facility] OR;  Service: Orthopedics;  Laterality: N/A;   Family History  Problem Relation Age of Onset  . Cancer Mother     colon and lung  . Cancer Father     lung  . Heart disease Father   . Heart disease Sister   . Hypertension Sister   . Diabetes Sister   . Mental illness Sister   . Fibromyalgia Sister    History   Social History  . Marital  Status: Single    Spouse Name: N/A    Number of Children: N/A  . Years of Education: N/A   Occupational History  . Not on file.   Social History Main Topics  . Smoking status: Never Smoker   . Smokeless tobacco: Never Used  . Alcohol Use: No  . Drug Use: No  . Sexual Activity: Yes    Birth Control/ Protection: Surgical   Other Topics Concern  . Not on file   Social History Narrative  . No narrative on file   Allergies  Allergen Reactions  . Oxycodone Diarrhea and Nausea And Vomiting  . Codeine Other (See Comments)    Causes numbness on one side of body    Review of Systems   Objective:  Physical Exam  Nursing note and vitals reviewed. Constitutional: She is oriented to person, place, and time. She appears well-developed and well-nourished.  HENT:  Head: Normocephalic and atraumatic.  Right Ear: External ear normal.  Left Ear: External ear normal.  Nose: Nose normal.  Mouth/Throat: Oropharynx is clear and moist. No oropharyngeal exudate.  Eyes: Conjunctivae and EOM are normal. Pupils are equal, round, and reactive  to light.  Neck: Normal range of motion. Neck supple. No thyromegaly present.  Cardiovascular: Normal rate, regular rhythm and normal heart sounds.  Exam reveals no gallop and no friction rub.   No murmur heard. Pulmonary/Chest: Effort normal and breath sounds normal. No respiratory distress. She has no wheezes. She has no rales.  Abdominal: Soft. Bowel sounds are normal. There is no tenderness.  Multiple scars over the abdomen from surgery for her back.  Musculoskeletal: Normal range of motion. She exhibits no edema and no tenderness.  Lymphadenopathy:    She has no cervical adenopathy.  Neurological: She is alert and oriented to person, place, and time. She has normal reflexes.  Skin: Skin is warm and dry. No rash noted.  Scars over both knees with lack of full flexion at the knee.  Scar over the T11 to L2 area and scar over the left iliac area at the  sites of her nerve stimulator insertion.   Psychiatric: She has a normal mood and affect. Her behavior is normal.     BP 114/90  Pulse 87  Temp(Src) 98.6 F (37 C) (Oral)  Resp 16  Ht  (1.702 m)  Wt 211 lb 3.2 oz (95.8 kg)  BMI 33.07 kg/m2  SpO2 97% Assessment & Plan:  I personally performed the services described in this documentation, which was scribed in my presence. The recorded information has been reviewed and is accurate. She does meet [place] for DOT. I told her with all of her orthopedic issues involving her neck back and having both knees replaced she should not be involved in any lifting at work. 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

## 2014-08-26 ENCOUNTER — Telehealth: Payer: Self-pay

## 2014-08-26 DIAGNOSIS — M542 Cervicalgia: Principal | ICD-10-CM

## 2014-08-26 DIAGNOSIS — G8929 Other chronic pain: Secondary | ICD-10-CM

## 2014-08-26 NOTE — Telephone Encounter (Signed)
DOOLITTLE - Pt went to go pick up her prescriptions and was told that they would not fill her flexeril because of the script of methocarbamol.  She says she is not taking the methocarbamol due to a reaction she had to it.  She also says she lost her insurance and wants to know if you will call her in generic cyclobinzoprine. [phone]

## 2014-08-27 MED ORDER — CYCLOBENZAPRINE HCL 10 MG PO TABS: 10.0000 mg | ORAL_TABLET | Freq: Three times a day (TID) | ORAL | Status: DC

## 2014-08-27 NOTE — Telephone Encounter (Signed)
Spoke to pt- she is aware the script sent to the pharmacy. She is asking for a refill of Phenergan now also. She does not have insurance to come in for an OV

## 2014-08-27 NOTE — Telephone Encounter (Signed)
Meds ordered this encounter  Medications  . cyclobenzaprine (FLEXERIL) 10 MG tablet    Sig: Take 1 tablet (10 mg total) by mouth 3 (three) times daily.    Dispense:  90 tablet    Refill:  5

## 2014-08-28 NOTE — Telephone Encounter (Signed)
LM for pt to RTC- when we spoke she was certain it was phenergan.

## 2014-08-28 NOTE — Telephone Encounter (Signed)
Phenergan not prescribed since oct '14--does she mean bentyl??(dicyclomine)

## 2014-09-22 ENCOUNTER — Other Ambulatory Visit: Payer: Self-pay | Admitting: Internal Medicine

## 2014-10-17 ENCOUNTER — Other Ambulatory Visit: Payer: Self-pay | Admitting: Internal Medicine

## 2014-10-17 NOTE — Telephone Encounter (Signed)
Dr Merla Richesoolittle, I sent in 1 mos of Bentyl. Do you want to send in other two w/note to RTC for more?

## 2014-10-21 NOTE — Telephone Encounter (Signed)
Called in tramadol, other two Rxs went electronically.

## 2014-11-24 ENCOUNTER — Other Ambulatory Visit: Payer: Self-pay | Admitting: Internal Medicine

## 2014-11-25 NOTE — Telephone Encounter (Signed)
Faxed

## 2014-12-17 ENCOUNTER — Ambulatory Visit: Payer: Self-pay | Admitting: Internal Medicine

## 2014-12-17 VITALS — BP 136/80 | HR 96 | Temp 97.8°F | Resp 18 | Ht 67.25 in | Wt 213.0 lb

## 2014-12-17 DIAGNOSIS — M542 Cervicalgia: Principal | ICD-10-CM

## 2014-12-17 DIAGNOSIS — Z024 Encounter for examination for driving license: Secondary | ICD-10-CM

## 2014-12-17 DIAGNOSIS — M549 Dorsalgia, unspecified: Secondary | ICD-10-CM

## 2014-12-17 DIAGNOSIS — F411 Generalized anxiety disorder: Secondary | ICD-10-CM

## 2014-12-17 DIAGNOSIS — I341 Nonrheumatic mitral (valve) prolapse: Secondary | ICD-10-CM

## 2014-12-17 DIAGNOSIS — G8929 Other chronic pain: Secondary | ICD-10-CM

## 2014-12-17 DIAGNOSIS — G43709 Chronic migraine without aura, not intractable, without status migrainosus: Secondary | ICD-10-CM

## 2014-12-17 DIAGNOSIS — I10 Essential (primary) hypertension: Secondary | ICD-10-CM

## 2014-12-17 MED ORDER — ALPRAZOLAM 0.25 MG PO TABS: ORAL_TABLET | ORAL | Status: DC

## 2014-12-17 MED ORDER — HYDROCODONE-ACETAMINOPHEN 5-325 MG PO TABS: 1.0000 | ORAL_TABLET | Freq: Every evening | ORAL | Status: DC | PRN

## 2014-12-17 MED ORDER — LISINOPRIL-HYDROCHLOROTHIAZIDE 20-12.5 MG PO TABS: 1.0000 | ORAL_TABLET | Freq: Every day | ORAL | Status: DC

## 2014-12-17 MED ORDER — TRAMADOL HCL 50 MG PO TABS: 50.0000 mg | ORAL_TABLET | Freq: Four times a day (QID) | ORAL | Status: DC | PRN

## 2014-12-17 MED ORDER — CYCLOBENZAPRINE HCL 10 MG PO TABS: 10.0000 mg | ORAL_TABLET | Freq: Three times a day (TID) | ORAL | Status: DC

## 2014-12-17 MED ORDER — PROMETHAZINE HCL 12.5 MG PO TABS: 12.5000 mg | ORAL_TABLET | Freq: Four times a day (QID) | ORAL | Status: DC | PRN

## 2014-12-17 MED ORDER — SUMATRIPTAN SUCCINATE 100 MG PO TABS: ORAL_TABLET | ORAL | Status: DC

## 2014-12-17 MED ORDER — MELOXICAM 7.5 MG PO TABS: 7.5000 mg | ORAL_TABLET | Freq: Two times a day (BID) | ORAL | Status: DC

## 2014-12-17 MED ORDER — GABAPENTIN 300 MG PO CAPS
600.0000 mg | ORAL_CAPSULE | Freq: Every day | ORAL | Status: DC
Start: 2014-12-17 — End: 2015-07-25

## 2014-12-17 NOTE — Progress Notes (Signed)
Subjective:    Patient ID: Carolyn Allen, female    DOB: November 23, 1966, 49 y.o.   MRN: [id number] This chart was scribed for Ellamae Sia, MD by Littie Deeds, Medical Scribe. This patient was seen in Room 12 and the patient's care was started at 4:16 PM.   HPI HPI Comments: Carolyn Allen is a 49 y.o. female with a hx of chronic back pain after a work injury in 2006? and HTN who presents to the [facility] for a DMV physical. 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 She could only get a 1 year card for DOT due to her blood pressure. Patient will also need to have another separate physical and TB test for the school if employed there.  Patient reports that her back pain has increased while she was working for a temp agency for about 3 months. She was doing fairly well when sedentary. She has a hx of back surgery (spinal cord stimulator insertion) a little over 1 year ago. Dr. Shon Baton. Patient has still been using flexeril, using it about twice during the day and once at bedtime. Patient has been using tramadol but gets very little improvement with this. When working she needs something stronger to sleep as she has taken in the past. She has been unable to do physical therapy due to cost.  She is only on lisinopril/hct for her HTN.  Patient Active Problem List   Diagnosis Date Noted   Mitral valve prolapse--stable  12/18/2014   Unspecified constipation 01/08/2014   HTN (hypertension) 04/19/2012   Migraine headache--- needs a refill of Imitrex //this only works some of the time  04/19/2012   IBS (irritable bowel syndrome) 04/19/2012   GAD (generalized anxiety disorder)--- alprazolam when necessary  04/19/2012   Depression----much improved  04/19/2012   AR (allergic rhinitis) 04/19/2012   Chronic back pain 04/19/2012   Chronic neck pain 04/19/2012     Past Surgical History  Procedure  Laterality Date   Abdominal hysterectomy     Spine surgery     Cystectomy     Shoulder surgery     Joint replacement      both knees   Tmj arthroplasty     Spinal cord stimulator insertion N/A 10/08/2013    Procedure: LUMBAR SPINAL CORD STIMULATOR INSERTION;  Surgeon: Venita Lick, MD;  Location: [facility] OR;  Service: Orthopedics;  Laterality: N/A;     Review of Systems Noncontributory    Objective:   Physical Exam  BP 136/80 mmHg   Pulse 96   Temp(Src) 97.8 F (36.6 C) (Oral)   Resp 18   Ht 5' 7.25" (1.708 m)   Wt 213 lb (96.616 kg)   BMI 33.12 kg/m2   SpO2 100%  CONSTITUTIONAL: Well developed/well nourished HEAD: Normocephalic/atraumatic EYES: EOM/PERRL CV: S1/S2 noted, no murmurs/rubs/gallops noted LUNGS: Lungs are clear to auscultation bilaterally, no apparent distress NEURO: Pt is awake/alert, cranial nerves II through XII intact. Gait normal. Balance intact. EXTREMITIES: pulses normal, full ROM/no edema SKIN: warm, color normal/no rashes PSYCH: Mood good affect appropriate  Thought content normal and judgment sound        Assessment & Plan:  Chronic neck pain - Plan: cyclobenzaprine (FLEXERIL) 10 MG tablet, HYDROcodone-acetaminophen (NORCO/VICODIN) 5-325 MG per tablet  Essential hypertension - Plan: lisinopril-hydrochlorothiazide (ZESTORETIC) 20-12.5 MG per tablet  Chronic migraine without aura without status migrainosus, not intractable  GAD (generalized anxiety disorder)  Chronic back pain  Mitral valve prolapse  Meds ordered this encounter  Medications   SUMAtriptan (IMITREX) 100 MG tablet    Sig: One at onset of headache. No more than 2 in 24hrs or 4 in a week.    Dispense:  10 tablet    Refill:  5   ALPRAZolam (XANAX) 0.25 MG tablet    Sig: TAKE 1 TABLET THREE TIMES DAILY AS NEEDED FOR ANXIETY    Dispense:  90 tablet    Refill:  5   cyclobenzaprine (FLEXERIL) 10 MG tablet    Sig: Take 1 tablet (10 mg total) by mouth 3 (three) times daily.      Dispense:  90 tablet    Refill:  5   gabapentin (NEURONTIN) 300 MG capsule    Sig: Take 2 capsules (600 mg total) by mouth at bedtime.    Dispense:  60 capsule    Refill:  5   lisinopril-hydrochlorothiazide (ZESTORETIC) 20-12.5 MG per tablet    Sig: Take 1 tablet by mouth daily.    Dispense:  90 tablet    Refill:  3   promethazine (PHENERGAN) 12.5 MG tablet    Sig: Take 1 tablet (12.5 mg total) by mouth 4 (four) times daily as needed.    Dispense:  30 tablet    Refill:  5   traMADol (ULTRAM) 50 MG tablet    Sig: Take 1 tablet (50 mg total) by mouth every 6 (six) hours as needed.    Dispense:  120 tablet    Refill:  5   HYDROcodone-acetaminophen (NORCO/VICODIN) 5-325 MG per tablet    Sig: Take 1 tablet by mouth at bedtime as needed for moderate pain.    Dispense:  30 tablet    Refill:  0   meloxicam (MOBIC) 7.5 MG tablet    Sig: Take 1 tablet (7.5 mg total) by mouth 2 (two) times daily.    Dispense:  60 tablet    Refill:  11   Forms forDMV scanned There should be no restriction on driving other than that already imposed by the DOT She is given the website "[employer]" for home physical therapy as she needs a lot of work to improve her current deconditioning if she wants to eliminate further back pain  I have completed the patient encounter in its entirety as documented by the scribe, with editing by me where necessary. Robert P. Merla Richesoolittle, M.D.

## 2014-12-18 DIAGNOSIS — I341 Nonrheumatic mitral (valve) prolapse: Secondary | ICD-10-CM | POA: Insufficient documentation

## 2014-12-23 ENCOUNTER — Encounter: Payer: Self-pay | Admitting: Internal Medicine

## 2014-12-23 ENCOUNTER — Telehealth: Payer: Self-pay

## 2014-12-23 NOTE — Telephone Encounter (Signed)
The fax number was [phone]

## 2014-12-23 NOTE — Telephone Encounter (Signed)
Faxed

## 2014-12-23 NOTE — Telephone Encounter (Signed)
Please advise if letter can be written for pt to have no restrictions.

## 2014-12-23 NOTE — Telephone Encounter (Signed)
Pt states she need a note from  Dr. 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

## 2014-12-23 NOTE — Telephone Encounter (Signed)
Ok to fax/call

## 2015-01-18 ENCOUNTER — Telehealth: Payer: Self-pay

## 2015-01-18 DIAGNOSIS — G8929 Other chronic pain: Secondary | ICD-10-CM

## 2015-01-18 DIAGNOSIS — M542 Cervicalgia: Principal | ICD-10-CM

## 2015-01-18 NOTE — Telephone Encounter (Signed)
Pt stating she needs a refill on hydrocodone. CB [phone]

## 2015-01-20 MED ORDER — HYDROCODONE-ACETAMINOPHEN 5-325 MG PO TABS: 1.0000 | ORAL_TABLET | Freq: Every evening | ORAL | Status: DC | PRN

## 2015-01-21 NOTE — Telephone Encounter (Signed)
Note done 2/8

## 2015-01-21 NOTE — Telephone Encounter (Signed)
Left message Rx ready to pick up. Rx in pick up draw.

## 2015-02-20 ENCOUNTER — Telehealth: Payer: Self-pay

## 2015-02-20 DIAGNOSIS — G8929 Other chronic pain: Secondary | ICD-10-CM

## 2015-02-20 DIAGNOSIS — M542 Cervicalgia: Principal | ICD-10-CM

## 2015-02-20 NOTE — Telephone Encounter (Signed)
Requesting HYDROcodone-acetaminophen (NORCO/VICODIN) 5-325 MG per tablet   [phone]

## 2015-02-20 NOTE — Telephone Encounter (Signed)
Please check who this is supposed to go to. [facility] or [facility]. Merla Richesoolittle but I have never seen the patient.  Wallis BambergMario Tan Clopper, PA-C [facility] [phone] 02/20/2015  6:11 PM

## 2015-02-20 NOTE — Telephone Encounter (Signed)
Would you refill this for her tomorrow--Im[facility] account for the next week

## 2015-02-21 MED ORDER — HYDROCODONE-ACETAMINOPHEN 5-325 MG PO TABS: 1.0000 | ORAL_TABLET | Freq: Every evening | ORAL | Status: DC | PRN

## 2015-02-21 NOTE — Telephone Encounter (Signed)
Refilled #30 supply. Please advise pt she will need to come in to see Dr Merla Richesoolittle for any further refills as we did this for her since he was out of town. Thank you. [facility].

## 2015-02-21 NOTE — Telephone Encounter (Signed)
I believe he is asking for someone to do this for him since he is out for a week. Please advise.

## 2015-02-25 MED ORDER — HYDROCODONE-ACETAMINOPHEN 5-325 MG PO TABS: 1.0000 | ORAL_TABLET | Freq: Every evening | ORAL | Status: DC | PRN

## 2015-02-25 NOTE — Telephone Encounter (Signed)
Norco script was printed off and [facility] 02/22/15. It was never picked up by pt and unfortunately has been misplaced. Will print one more #30 supply. As before, please advise pt she will need to come in to see Dr Merla Richesoolittle for any further refills as we are only doing this for her since he is out of town. Thank you.

## 2015-02-25 NOTE — Telephone Encounter (Signed)
Todd, pt called to check on this Rx and it is not in the p/up drawer. Do you know what happened to it after you printed/signed it? It wasn't in my box Monday morning, so I'm not sure where it went over the weekend. We may have to print another copy if we can't locate it.

## 2015-02-25 NOTE — Addendum Note (Signed)
Addended byAgustin Cree: Jacqualine Weichel M on: 02/25/2015 11:01 AM   Modules accepted: Orders

## 2015-02-25 NOTE — Telephone Encounter (Signed)
First Rx was located at 104 and new Rx was shredded by Revonda Standard[facility]ison. Called pt and she will p/up at 104. I advised pt that she needs ov for more and she said that she was just here for her DMV PE that cost her $150, and she has no insurance. I advised her that I will check w/Dr Merla Richesoolittle when he returns to find out when she needs to f/up for ADD. Dr Merla Richesoolittle, please advise.

## 2015-03-04 ENCOUNTER — Other Ambulatory Visit: Payer: Self-pay | Admitting: Internal Medicine

## 2015-03-04 MED ORDER — DICYCLOMINE HCL 20 MG PO TABS: 20.0000 mg | ORAL_TABLET | Freq: Four times a day (QID) | ORAL | Status: DC

## 2015-03-04 NOTE — Telephone Encounter (Signed)
Pharmacy called for pt refills on paroxetine and Bentyl. Rx's send in

## 2015-03-26 ENCOUNTER — Telehealth: Payer: Self-pay

## 2015-03-26 DIAGNOSIS — G8929 Other chronic pain: Secondary | ICD-10-CM

## 2015-03-26 DIAGNOSIS — M542 Cervicalgia: Principal | ICD-10-CM

## 2015-03-26 NOTE — Telephone Encounter (Signed)
Pt in need of her HYDROCODONE 5-325 MG. Please call [phone] when ready for pick up

## 2015-03-28 MED ORDER — HYDROCODONE-ACETAMINOPHEN 5-325 MG PO TABS: 1.0000 | ORAL_TABLET | Freq: Every evening | ORAL | Status: DC | PRN

## 2015-03-28 NOTE — Telephone Encounter (Signed)
Waiting on rx to be signed.

## 2015-03-28 NOTE — Telephone Encounter (Signed)
Rx ready to pick up. Left Vm letting her know.

## 2015-04-25 ENCOUNTER — Telehealth: Payer: Self-pay

## 2015-04-25 DIAGNOSIS — G8929 Other chronic pain: Secondary | ICD-10-CM

## 2015-04-25 DIAGNOSIS — M542 Cervicalgia: Principal | ICD-10-CM

## 2015-04-25 MED ORDER — HYDROCODONE-ACETAMINOPHEN 5-325 MG PO TABS: 1.0000 | ORAL_TABLET | Freq: Every evening | ORAL | Status: DC | PRN

## 2015-04-25 NOTE — Telephone Encounter (Signed)
Pt requesting refill on HYDROcodone-acetaminophen (NORCO/VICODIN) 5-325 MG per tablet [[id number]][[id number]]

## 2015-04-25 NOTE — Telephone Encounter (Signed)
Rx ready.

## 2015-04-26 NOTE — Telephone Encounter (Signed)
Pt notified on her voicemail about Rx.

## 2015-06-02 ENCOUNTER — Telehealth: Payer: Self-pay

## 2015-06-02 NOTE — Telephone Encounter (Signed)
Ready to pick up. Pt called about this already.

## 2015-06-02 NOTE — Telephone Encounter (Signed)
Pt requesting Hydrochodone refill   Best phone for pt is [phone]

## 2015-06-03 ENCOUNTER — Other Ambulatory Visit: Payer: Self-pay | Admitting: Internal Medicine

## 2015-06-03 NOTE — Telephone Encounter (Signed)
Faxed

## 2015-06-04 ENCOUNTER — Other Ambulatory Visit: Payer: Self-pay

## 2015-06-04 DIAGNOSIS — M542 Cervicalgia: Principal | ICD-10-CM

## 2015-06-04 DIAGNOSIS — G8929 Other chronic pain: Secondary | ICD-10-CM

## 2015-06-04 MED ORDER — HYDROCODONE-ACETAMINOPHEN 5-325 MG PO TABS: 1.0000 | ORAL_TABLET | Freq: Every evening | ORAL | Status: DC | PRN

## 2015-06-04 NOTE — Telephone Encounter (Signed)
Refill on Hydrocodone 

## 2015-06-05 ENCOUNTER — Other Ambulatory Visit: Payer: Self-pay

## 2015-06-05 MED ORDER — DICYCLOMINE HCL 20 MG PO TABS: 20.0000 mg | ORAL_TABLET | Freq: Four times a day (QID) | ORAL | Status: DC

## 2015-06-05 NOTE — Telephone Encounter (Signed)
Pt just came in and picked it up

## 2015-06-20 ENCOUNTER — Other Ambulatory Visit: Payer: Self-pay | Admitting: Internal Medicine

## 2015-06-21 ENCOUNTER — Telehealth: Payer: Self-pay

## 2015-06-21 NOTE — Telephone Encounter (Signed)
Patient called into today stating that she needs her dicyclomine (BENTYL) 20 MG tablet refilled because she is completely out and doesn't have the money to come into the office to be seen. She is a patient of Dr. Merla Richesoolittle, she states that it is getting to where she can not eat anything because her stomach is hurting.   Please call the patient back and let her know. Her call back number is [phone].

## 2015-06-24 NOTE — Telephone Encounter (Signed)
You certainly can call in a one-month prescription of Bentyl Paxil meloxicam I will need to see her before I can write another prescription of tramadol

## 2015-06-24 NOTE — Telephone Encounter (Signed)
Pt called back back and she also needs Tramadol, Paxil, and Meloxicam. She states she will try to come in on Saturday to see Dr. Merla Richesoolittle.

## 2015-06-25 MED ORDER — DICYCLOMINE HCL 20 MG PO TABS: 20.0000 mg | ORAL_TABLET | Freq: Four times a day (QID) | ORAL | Status: DC

## 2015-06-25 MED ORDER — PAROXETINE HCL 20 MG PO TABS: 20.0000 mg | ORAL_TABLET | Freq: Every morning | ORAL | Status: DC

## 2015-06-25 MED ORDER — MELOXICAM 7.5 MG PO TABS: 7.5000 mg | ORAL_TABLET | Freq: Two times a day (BID) | ORAL | Status: DC

## 2015-06-25 NOTE — Telephone Encounter (Signed)
Left a detailed message letting pt know Rx's were sent in and she would have to RTC for Tramadol.

## 2015-06-26 NOTE — Telephone Encounter (Signed)
Dr Merla Richesoolittle, you had printed Rxs for Tramadol and also alprazolam from 06/20/15 Rx RF request. After reading your instr's on this phone message, I wasn't sure you wanted to give pt the Rx for Tramadol (or alprazolam), so instead of calling them in, I have put them back in your box for review/signature tomorrow.

## 2015-06-30 NOTE — Telephone Encounter (Signed)
i didn't sign She didn't come in Let me know if she calls

## 2015-07-04 ENCOUNTER — Telehealth: Payer: Self-pay

## 2015-07-04 MED ORDER — ALPRAZOLAM 0.25 MG PO TABS: ORAL_TABLET | ORAL | Status: DC

## 2015-07-04 MED ORDER — TRAMADOL HCL 50 MG PO TABS: 50.0000 mg | ORAL_TABLET | Freq: Four times a day (QID) | ORAL | Status: DC | PRN

## 2015-07-04 NOTE — Telephone Encounter (Signed)
Tonye Pearson, MD at 06/24/2015 10:17 PM     Status: Signed       Expand All Collapse All   You certainly can call in a one-month prescription of Bentyl Paxil meloxicam I will need to see her before I can write another prescription of tramadol         Spoke with pt, she is supposed to come in to be seen according to last telephone note. Pt seemed reluctant but was given hours for Dr. Merla Riches today.

## 2015-07-04 NOTE — Telephone Encounter (Signed)
Scheduled Meds: Continuous Infusions: PRN Meds:. Meds ordered this encounter  Medications  . traMADol (ULTRAM) 50 MG tablet    Sig: Take 1 tablet (50 mg total) by mouth every 6 (six) hours as needed.    Dispense:  120 tablet    Refill:  0  . ALPRAZolam (XANAX) 0.25 MG tablet    Sig: TAKE 1 TABLET THREE TIMES DAILY AS NEEDED FOR ANXIETY    Dispense:  90 tablet    Refill:  0   due to circumstances she cannot return for a visit yet

## 2015-07-04 NOTE — Addendum Note (Signed)
Addended by: Tonye Pearson on: 07/04/2015 07:06 PM   Modules accepted: Orders

## 2015-07-04 NOTE — Telephone Encounter (Signed)
Pt is needing a refill on pain meds and tramadal

## 2015-07-07 NOTE — Telephone Encounter (Signed)
Left VM telling pt rx is ready to be picked up at office.

## 2015-07-25 ENCOUNTER — Ambulatory Visit (INDEPENDENT_AMBULATORY_CARE_PROVIDER_SITE_OTHER): Payer: Self-pay | Admitting: Internal Medicine

## 2015-07-25 VITALS — BP 122/86 | HR 88 | Temp 98.2°F | Resp 14 | Ht 67.25 in | Wt 215.0 lb

## 2015-07-25 DIAGNOSIS — I10 Essential (primary) hypertension: Secondary | ICD-10-CM

## 2015-07-25 DIAGNOSIS — F329 Major depressive disorder, single episode, unspecified: Secondary | ICD-10-CM

## 2015-07-25 DIAGNOSIS — M549 Dorsalgia, unspecified: Secondary | ICD-10-CM

## 2015-07-25 DIAGNOSIS — F32A Depression, unspecified: Secondary | ICD-10-CM

## 2015-07-25 DIAGNOSIS — G8929 Other chronic pain: Secondary | ICD-10-CM

## 2015-07-25 DIAGNOSIS — K589 Irritable bowel syndrome without diarrhea: Secondary | ICD-10-CM

## 2015-07-25 DIAGNOSIS — M542 Cervicalgia: Secondary | ICD-10-CM

## 2015-07-25 DIAGNOSIS — F411 Generalized anxiety disorder: Secondary | ICD-10-CM

## 2015-07-25 MED ORDER — HYDROCODONE-ACETAMINOPHEN 5-325 MG PO TABS: 1.0000 | ORAL_TABLET | Freq: Every evening | ORAL | Status: DC | PRN

## 2015-07-25 MED ORDER — TRAMADOL HCL 50 MG PO TABS: 50.0000 mg | ORAL_TABLET | Freq: Four times a day (QID) | ORAL | Status: DC | PRN

## 2015-07-25 MED ORDER — DICYCLOMINE HCL 20 MG PO TABS: 20.0000 mg | ORAL_TABLET | Freq: Four times a day (QID) | ORAL | Status: DC

## 2015-07-25 MED ORDER — TERBINAFINE HCL 250 MG PO TABS: 250.0000 mg | ORAL_TABLET | Freq: Every day | ORAL | Status: DC

## 2015-07-25 MED ORDER — MELOXICAM 7.5 MG PO TABS: 7.5000 mg | ORAL_TABLET | Freq: Two times a day (BID) | ORAL | Status: DC

## 2015-07-25 MED ORDER — CYCLOBENZAPRINE HCL 10 MG PO TABS: 10.0000 mg | ORAL_TABLET | Freq: Three times a day (TID) | ORAL | Status: DC

## 2015-07-25 MED ORDER — SUMATRIPTAN SUCCINATE 100 MG PO TABS: ORAL_TABLET | ORAL | Status: DC

## 2015-07-25 MED ORDER — PAROXETINE HCL 20 MG PO TABS: 20.0000 mg | ORAL_TABLET | Freq: Every morning | ORAL | Status: DC

## 2015-07-25 MED ORDER — PROMETHAZINE HCL 12.5 MG PO TABS: 12.5000 mg | ORAL_TABLET | Freq: Four times a day (QID) | ORAL | Status: DC | PRN

## 2015-07-25 MED ORDER — ALPRAZOLAM 0.25 MG PO TABS: ORAL_TABLET | ORAL | Status: DC

## 2015-07-25 NOTE — Progress Notes (Signed)
Subjective:    Patient ID: Carolyn Allen, female    DOB: [birth year], 49 y.o.   MRN: [id number] This chart was scribed for Ellamae Sia, MD by Jolene Provost, Medical Scribe. This patient was seen in Room 11 and the patient's care was started a 4:55 PM.  Chief Complaint  Patient presents with  . Medication Refill    HPI HPI Comments: Carolyn Allen is a 49 y.o. female who presents to [facility] reporting for a medication refill. Pt states she continues to have chronic neck pain that radiates down her back,  and chronic lumbar pain. Dr. Shon Baton was the doctor who said it wasn't worth doing neck surgery and felt like chronic pain medication was best for now. Pt states she takes tramadol during the day, and hydrocodone at night. She tries to minimize doses as she dislikes medication. She does not want surgery. This was all in the aftermath of Worker's Comp. injuries. She has had surgery on her low back. Injections did not work. She unfortunately lost her insurance coverage and no longer can see Dr. Shon Baton.  She is also complaining of a hardened callus on the bilateral balls of her feet that split open and become very painful making it difficult to walk.  She also complains of toenail fungus on her bilateral feet.  Past treatment here included treatment for depression and anxiety as well as irritable bowel syndrome and migraine headaches. These issues are well controlled with current medications. She has a new job and a new boyfriend that is stable after several years.  Review of Systems  Constitutional: Negative for fever and chills.  Musculoskeletal: Positive for back pain and neck pain.  Neurological: Positive for numbness. Negative for weakness.       Tingling  Psychiatric/Behavioral: Positive for sleep disturbance.       Objective:   Physical Exam  Constitutional: She is oriented to person, place, and time. She appears well-developed and well-nourished. No distress.  HENT:    Head: Normocephalic and atraumatic.  Eyes: Pupils are equal, round, and reactive to light.  Neck: Neck supple.  Cardiovascular: Normal rate.   Pulmonary/Chest: Effort normal. No respiratory distress.  Musculoskeletal: Normal range of motion.  Neurological: She is alert and oriented to person, place, and time. Coordination normal.  Skin: Skin is warm and dry. She is not diaphoretic.  Several toenails affected by fungus//fungus first web space Callus formation medially at heel and forefoot bilaterally  Psychiatric: She has a normal mood and affect. Her behavior is normal.  Nursing note and vitals reviewed.   Filed Vitals:   07/25/15 1522  BP: 122/86  Pulse: 88  Temp: 98.2 F (36.8 C)  TempSrc: Oral  Resp: 14  Height: 5' 7.25" (1.708 m)  Weight: 215 lb (97.523 kg)  SpO2: 98%       Assessment & Plan:  Chronic neck pain - Plan: HYDROcodone-acetaminophen (NORCO/VICODIN) 5-325 MG per tablet, cyclobenzaprine (FLEXERIL) 10 MG tablet, HYDROcodone-acetaminophen (NORCO/VICODIN) 5-325 MG per tablet, HYDROcodone-acetaminophen (NORCO/VICODIN) 5-325 MG per tablet  IBS (irritable bowel syndrome)  Essential hypertension  GAD (generalized anxiety disorder)  Depression  Chronic back pain  Onychomycosis-she elects lamisil 12 weeks  Callous formation feet--- discussed outpatient regimen including moleskin Meds ordered this encounter  Medications  . HYDROcodone-acetaminophen (NORCO/VICODIN) 5-325 MG per tablet    Sig: Take 1 tablet by mouth at bedtime as needed for moderate pain.    Dispense:  30 tablet    Refill:  0  . traMADol (ULTRAM)  50 MG tablet    Sig: Take 1 tablet (50 mg total) by mouth every 6 (six) hours as needed.    Dispense:  120 tablet    Refill:  5  . SUMAtriptan (IMITREX) 100 MG tablet    Sig: One at onset of headache. No more than 2 in 24hrs or 4 in a week.    Dispense:  10 tablet    Refill:  5  . cyclobenzaprine (FLEXERIL) 10 MG tablet    Sig: Take 1 tablet (10  mg total) by mouth 3 (three) times daily.    Dispense:  90 tablet    Refill:  5  . ALPRAZolam (XANAX) 0.25 MG tablet    Sig: TAKE 1 TABLET THREE TIMES DAILY AS NEEDED FOR ANXIETY    Dispense:  90 tablet    Refill:  5  . dicyclomine (BENTYL) 20 MG tablet    Sig: Take 1 tablet (20 mg total) by mouth every 6 (six) hours.    Dispense:  120 tablet    Refill:  5  . meloxicam (MOBIC) 7.5 MG tablet    Sig: Take 1 tablet (7.5 mg total) by mouth 2 (two) times daily.    Dispense:  60 tablet    Refill:  11  . PARoxetine (PAXIL) 20 MG tablet    Sig: Take 1 tablet (20 mg total) by mouth every morning.    Dispense:  90 tablet    Refill:  3  . promethazine (PHENERGAN) 12.5 MG tablet    Sig: Take 1 tablet (12.5 mg total) by mouth 4 (four) times daily as needed.    Dispense:  30 tablet    Refill:  5  . HYDROcodone-acetaminophen (NORCO/VICODIN) 5-325 MG per tablet    Sig: Take 1 tablet by mouth at bedtime as needed for moderate pain. For 30d after signed    Dispense:  30 tablet    Refill:  0  . HYDROcodone-acetaminophen (NORCO/VICODIN) 5-325 MG per tablet    Sig: Take 1 tablet by mouth at bedtime as needed for moderate pain. For 60d after signed    Dispense:  30 tablet    Refill:  0  . terbinafine (LAMISIL) 250 MG tablet    Sig: Take 1 tablet (250 mg total) by mouth daily. For 3 months    Dispense:  30 tablet    Refill:  2   when insured we will pursue further physical therapy and chronic pain modalities I have completed the patient encounter in its entirety as documented by the scribe, with editing by me where necessary. Querida Beretta P. Merla Riches, M.D.

## 2015-08-24 ENCOUNTER — Other Ambulatory Visit: Payer: Self-pay | Admitting: Internal Medicine

## 2015-08-30 ENCOUNTER — Ambulatory Visit (INDEPENDENT_AMBULATORY_CARE_PROVIDER_SITE_OTHER): Payer: Self-pay | Admitting: Emergency Medicine

## 2015-08-30 VITALS — BP 124/82 | HR 96 | Temp 98.5°F | Resp 16 | Ht 67.0 in | Wt 213.0 lb

## 2015-08-30 DIAGNOSIS — Z0000 Encounter for general adult medical examination without abnormal findings: Secondary | ICD-10-CM

## 2015-08-30 NOTE — Progress Notes (Addendum)
Patient ID: Carolyn Allen, female   DOB: 04-14-1966, 50 y.o.   MRN: [id number]   This chart was scribed for Collene Gobble, MD by Charline Bills, ED Scribe. The patient was seen in room 5. Patient's care was started at 8:57 AM.  Chief Complaint:  Chief Complaint  Patient presents with  . Employment Physical    DOT Physical   HPI: Carolyn Allen is a 49 y.o. female, with a h/o HTN, who reports to [facility] today for a DOT physical. 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 Pt wears prescriptions glasses.   Dr. Merla Riches is currently managing pt's pain. She had a back fusion done 18 years ago and a lumbar spinal cord stimulator inserted 2 years ago by Venita Lick, MD. She reports significant relief with both procedures.   Past Medical History  Diagnosis Date  . Depression   . Hypertension   . HSV (herpes simplex virus) infection   . Migraines   . Mitral valve prolapse   . Back pain   . Allergy   . Arthritis   . PONV (postoperative nausea and vomiting)   . TMJ (temporomandibular joint syndrome)   . Heart murmur    Past Surgical History  Procedure Laterality Date  . Abdominal hysterectomy    . Spine surgery    . Cystectomy    . Shoulder surgery    . Joint replacement      both knees  . Tmj arthroplasty    . Spinal cord stimulator insertion N/A 10/08/2013    Procedure: LUMBAR SPINAL CORD STIMULATOR INSERTION;  Surgeon: Venita Lick, MD;  Location: [facility] OR;  Service: Orthopedics;  Laterality: N/A;   Social History   Social History  . Marital Status: Single    Spouse Name: N/A  . Number of Children: N/A  . Years of Education: N/A   Social History Main Topics  . Smoking status: Never Smoker   . Smokeless tobacco: Never Used  . Alcohol Use: No  . Drug Use: No  . Sexual  Activity: Yes    Birth Control/ Protection: Surgical   Other Topics Concern  . None   Social History Narrative   Family History  Problem Relation Age of Onset  . Cancer Mother     colon and lung  . Cancer Father     lung  . Heart disease Father   . Heart disease Sister   . Hypertension Sister   . Diabetes Sister   . Mental illness Sister   . Fibromyalgia Sister    Allergies  Allergen Reactions  . Oxycodone Diarrhea and Nausea And Vomiting  . Codeine Other (See Comments)    Causes numbness on one side of body   Prior to Admission medications   Medication Sig Start Date End Date Taking? Authorizing Provider  ALPRAZolam Prudy Feeler) 0.25 MG tablet TAKE 1 TABLET THREE TIMES DAILY AS NEEDED FOR ANXIETY 07/25/15  Yes Tonye Pearson, MD  Calcium Carbonate-Vitamin D (CALCIUM + D PO) Take 1 tablet by mouth daily.   Yes Historical Provider, MD  cyclobenzaprine (FLEXERIL) 10 MG tablet Take 1 tablet (10 mg total) by mouth 3 (three) times daily. 07/25/15  Yes Tonye Pearson, MD  dicyclomine (BENTYL) 20 MG tablet Take 1 tablet (20 mg total)  by mouth every 6 (six) hours. 07/25/15  Yes Tonye Pearson, MD  HYDROcodone-acetaminophen (NORCO/VICODIN) 5-325 MG per tablet Take 1 tablet by mouth at bedtime as needed for moderate pain. 07/25/15  Yes Tonye Pearson, MD  HYDROcodone-acetaminophen (NORCO/VICODIN) 5-325 MG per tablet Take 1 tablet by mouth at bedtime as needed for moderate pain. For 30d after signed 07/25/15  Yes Tonye Pearson, MD  HYDROcodone-acetaminophen (NORCO/VICODIN) 5-325 MG per tablet Take 1 tablet by mouth at bedtime as needed for moderate pain. For 60d after signed 07/25/15  Yes Tonye Pearson, MD  lisinopril-hydrochlorothiazide (ZESTORETIC) 20-12.5 MG per tablet Take 1 tablet by mouth daily. 12/17/14 12/17/15 Yes Tonye Pearson, MD  meloxicam [facility]) 7.5 MG tablet  11/09/13  Yes Historical Provider, MD  meloxicam (MOBIC) 7.5 MG tablet Take 1 tablet (7.5 mg total)  by mouth 2 (two) times daily. 07/25/15  Yes Tonye Pearson, MD  Multiple Vitamins-Minerals (MULTIVITAMIN WITH MINERALS) tablet Take 1 tablet by mouth daily.   Yes Historical Provider, MD  PARoxetine (PAXIL) 20 MG tablet Take 1 tablet (20 mg total) by mouth every morning. 07/25/15  Yes Tonye Pearson, MD  promethazine (PHENERGAN) 12.5 MG tablet Take 1 tablet (12.5 mg total) by mouth 4 (four) times daily as needed. 07/25/15  Yes Tonye Pearson, MD  SUMAtriptan (IMITREX) 100 MG tablet Take 100 mg by mouth every 2 (two) hours as needed for migraine. 04/19/12  Yes Tonye Pearson, MD  SUMAtriptan (IMITREX) 100 MG tablet One at onset of headache. No more than 2 in 24hrs or 4 in a week. 07/25/15  Yes Tonye Pearson, MD  terbinafine (LAMISIL) 250 MG tablet Take 1 tablet (250 mg total) by mouth daily. For 3 months 07/25/15  Yes Tonye Pearson, MD  traMADol (ULTRAM) 50 MG tablet Take 1 tablet (50 mg total) by mouth every 6 (six) hours as needed. 07/25/15  Yes Tonye Pearson, MD     ROS: The patient denies fevers, chills, night sweats, unintentional weight loss, chest pain, palpitations, wheezing, dyspnea on exertion, nausea, vomiting, abdominal pain, dysuria, hematuria, melena, numbness, weakness, or tingling.   All other systems have been reviewed and were otherwise negative with the exception of those mentioned in the HPI and as above.    PHYSICAL EXAM: Filed Vitals:   08/30/15 0842  BP: 124/82  Pulse: 96  Temp: 98.5 F (36.9 C)  Resp: 16   Body mass index is 33.35 kg/(m^2).   General: Alert, no acute distress HEENT:  Normocephalic, atraumatic, oropharynx patent. Poor dentition.  Eye: Nonie Hoyer [facility] Cardiovascular:  Regular rate and rhythm, no rubs murmurs or gallops.  No Carotid bruits, radial pulse intact. No pedal edema.  Respiratory: Clear to auscultation bilaterally.  No wheezes, rales, or rhonchi.  No cyanosis, no use of accessory musculature Abdominal: No  organomegaly, abdomen is soft and non-tender, positive bowel sounds.  No masses. Musculoskeletal: Gait intact. No edema, tenderness. Implanted neuro-stimulator above R buttock.  Skin: No rashes. Neurologic: Facial musculature symmetric.  Psychiatric: Patient acts appropriately throughout our interaction. Lymphatic: No cervical or submandibular lymphadenopathy Genitourinary/Anorectal: No acute findings  LABS: Results for orders placed or performed during the hospital encounter of 10/03/13  Basic metabolic panel  Result Value Ref Range   Sodium 140 135 - 145 mEq/L   Potassium 3.1 (L) 3.5 - 5.1 mEq/L   Chloride 97 96 - 112 mEq/L   CO2 32 19 - 32 mEq/L   Glucose, Bld 74 70 -  99 mg/dL   BUN 13 6 - 23 mg/dL   Creatinine, Ser 1.61 0.50 - 1.10 mg/dL   Calcium 9.9 8.4 - 09.6 mg/dL   GFR calc non Af Amer 86 (L) >90 mL/min   GFR calc Af Amer >90 >90 mL/min  CBC  Result Value Ref Range   WBC 5.5 4.0 - 10.5 K/uL   RBC 4.64 3.87 - 5.11 MIL/uL   Hemoglobin 13.9 12.0 - 15.0 g/dL   HCT 04.5 40.9 - 81.1 %   MCV 87.7 78.0 - 100.0 fL   MCH 30.0 26.0 - 34.0 pg   MCHC 34.2 30.0 - 36.0 g/dL   RDW 91.4 78.2 - 95.6 %   Platelets 245 150 - 400 K/uL     EKG/XRAY:   Primary read interpreted by Dr. Cleta Alberts at [facility].   ASSESSMENT/PLAN: Patient qualifies for a 1 year card. She understands she cannot take any pain medications or benzos while driving. She is given a one-year car because of a history of hypertension. She is restricted lenses.I personally performed the services described in this documentation, which was scribed in my presence. The recorded information has been reviewed and is accurate.   Gross sideeffects, risk and benefits, and alternatives of medications d/w patient. Patient is aware that all medications have potential sideeffects and we are unable to predict every sideeffect or drug-drug interaction that may occur.  [name] MD 08/30/2015 8:54 AM

## 2015-09-18 ENCOUNTER — Other Ambulatory Visit: Payer: Self-pay | Admitting: Internal Medicine

## 2015-10-26 ENCOUNTER — Other Ambulatory Visit: Payer: Self-pay | Admitting: Internal Medicine

## 2015-11-10 ENCOUNTER — Encounter: Payer: Self-pay | Admitting: Internal Medicine

## 2015-11-24 ENCOUNTER — Telehealth: Payer: Self-pay

## 2015-11-24 DIAGNOSIS — G8929 Other chronic pain: Secondary | ICD-10-CM

## 2015-11-24 DIAGNOSIS — M542 Cervicalgia: Principal | ICD-10-CM

## 2015-11-24 NOTE — Telephone Encounter (Signed)
Pt is needing a refill on her night time pain meds and also wants dr Merla Richesdoolittle to recommend which provider here would be a good fit for her

## 2015-11-25 MED ORDER — HYDROCODONE-ACETAMINOPHEN 5-325 MG PO TABS: 1.0000 | ORAL_TABLET | Freq: Every evening | ORAL | Status: DC | PRN

## 2015-11-25 MED ORDER — HYDROCODONE-ACETAMINOPHEN 5-325 MG PO TABS
1.0000 | ORAL_TABLET | Freq: Every evening | ORAL | Status: DC | PRN
Start: 2015-11-25 — End: 2016-03-05

## 2015-11-25 NOTE — Telephone Encounter (Signed)
Meds ordered this encounter  Medications  . HYDROcodone-acetaminophen (NORCO/VICODIN) 5-325 MG tablet    Sig: Take 1 tablet by mouth at bedtime as needed for moderate pain.    Dispense:  30 tablet    Refill:  0  . HYDROcodone-acetaminophen (NORCO/VICODIN) 5-325 MG tablet    Sig: Take 1 tablet by mouth at bedtime as needed for moderate pain. For 30d after signed    Dispense:  30 tablet    Refill:  0  . HYDROcodone-acetaminophen (NORCO/VICODIN) 5-325 MG tablet    Sig: Take 1 tablet by mouth at bedtime as needed for moderate pain. For 60d after signed    Dispense:  30 tablet    Refill:  0   Tell her she can establish with Dr Katrinka BlazingSmith, Dr Clelia CroftShaw or Deboraha Sprangebbie Gessner FNP

## 2015-11-26 NOTE — Telephone Encounter (Signed)
Notified pt, and she asked me to write the names of the recommended providers on the envelope, which I will do.

## 2016-01-15 ENCOUNTER — Telehealth: Payer: Self-pay

## 2016-01-15 DIAGNOSIS — G4730 Sleep apnea, unspecified: Principal | ICD-10-CM

## 2016-01-15 DIAGNOSIS — G4710 Hypersomnia, unspecified: Secondary | ICD-10-CM

## 2016-01-15 NOTE — Telephone Encounter (Signed)
Pt would like for Dr. Merla Riches to return her phone call; she did not provide me with any additional information.

## 2016-01-16 NOTE — Telephone Encounter (Signed)
Attempted to call pt to get more information.

## 2016-01-16 NOTE — Telephone Encounter (Signed)
Pt says she now has insurance. Pt would like to have a sleep study ordered. Pt having trouble sleeping at night and is told she stops breathing at night. Pt also wondering if she needs to be referred to pain management after you retire.

## 2016-01-16 NOTE — Telephone Encounter (Signed)
Pt called checking status...[phone]

## 2016-01-19 NOTE — Telephone Encounter (Signed)
Ref for sleep study

## 2016-01-20 ENCOUNTER — Telehealth: Payer: Self-pay

## 2016-01-20 NOTE — Telephone Encounter (Signed)
The patient called to ask for a pain management referral from Dr Merla Riches.  She said Dr Merla Riches has always managed her pain, but due to his retirement and federal regulations, she needs to go to a clinic now.  She said she wanted to go to one in [place] but is unsure of the name.  She will call us back to let us know the name of the clinic.  CB#: [phone]

## 2016-01-20 NOTE — Telephone Encounter (Signed)
Patient is going to give insurance info.  Advised referral was sent

## 2016-01-23 ENCOUNTER — Telehealth: Payer: Self-pay

## 2016-01-23 DIAGNOSIS — G8929 Other chronic pain: Secondary | ICD-10-CM

## 2016-01-23 DIAGNOSIS — M542 Cervicalgia: Secondary | ICD-10-CM

## 2016-01-23 DIAGNOSIS — M549 Dorsalgia, unspecified: Principal | ICD-10-CM

## 2016-01-23 NOTE — Telephone Encounter (Signed)
Patient called to follow up on her request for a pain management referral.  She wants to go to [facility] in [place].  Please advise, thank you. ° °CB#: [phone] °

## 2016-01-23 NOTE — Telephone Encounter (Signed)
Patient called to follow up on her request for a pain management referral.  She wants to go to [facility] in [place].  Please advise, thank you.  CB#: [phone]

## 2016-01-27 ENCOUNTER — Encounter: Payer: Self-pay | Admitting: Neurology

## 2016-01-27 ENCOUNTER — Ambulatory Visit (INDEPENDENT_AMBULATORY_CARE_PROVIDER_SITE_OTHER): Payer: BLUE CROSS/BLUE SHIELD | Admitting: Neurology

## 2016-01-27 VITALS — BP 118/88 | HR 86 | Resp 20 | Ht 66.0 in | Wt 226.0 lb

## 2016-01-27 DIAGNOSIS — G4700 Insomnia, unspecified: Secondary | ICD-10-CM | POA: Insufficient documentation

## 2016-01-27 DIAGNOSIS — G4710 Hypersomnia, unspecified: Secondary | ICD-10-CM | POA: Diagnosis not present

## 2016-01-27 DIAGNOSIS — R0683 Snoring: Secondary | ICD-10-CM | POA: Diagnosis not present

## 2016-01-27 DIAGNOSIS — G894 Chronic pain syndrome: Secondary | ICD-10-CM | POA: Diagnosis not present

## 2016-01-27 DIAGNOSIS — G4730 Sleep apnea, unspecified: Secondary | ICD-10-CM

## 2016-01-27 NOTE — Patient Instructions (Signed)
Insomnia Insomnia is a sleep disorder that makes it difficult to fall asleep or to stay asleep. Insomnia can cause tiredness (fatigue), low energy, difficulty concentrating, mood swings, and poor performance at work or school.  There are three different ways to classify insomnia:  Difficulty falling asleep.  Difficulty staying asleep.  Waking up too early in the morning. Any type of insomnia can be long-term (chronic) or short-term (acute). Both are common. Short-term insomnia usually lasts for three months or less. Chronic insomnia occurs at least three times a week for longer than three months. CAUSES  Insomnia may be caused by another condition, situation, or substance, such as:  Anxiety.  Certain medicines.  Gastroesophageal reflux disease (GERD) or other gastrointestinal conditions.  Asthma or other breathing conditions.  Restless legs syndrome, sleep apnea, or other sleep disorders.  Chronic pain.  Menopause. This may include hot flashes.  Stroke.  Abuse of alcohol, tobacco, or illegal drugs.  Depression.  Caffeine.   Neurological disorders, such as Alzheimer disease.  An overactive thyroid (hyperthyroidism). The cause of insomnia may not be known. RISK FACTORS Risk factors for insomnia include:  Gender. Women are more commonly affected than men.  Age. Insomnia is more common as you get older.  Stress. This may involve your professional or personal life.  Income. Insomnia is more common in people with lower income.  Lack of exercise.   Irregular work schedule or night shifts.  Traveling between different time zones. SIGNS AND SYMPTOMS If you have insomnia, trouble falling asleep or trouble staying asleep is the main symptom. This may lead to other symptoms, such as:  Feeling fatigued.  Feeling nervous about going to sleep.  Not feeling rested in the morning.  Having trouble concentrating.  Feeling irritable, anxious, or depressed. TREATMENT   Treatment for insomnia depends on the cause. If your insomnia is caused by an underlying condition, treatment will focus on addressing the condition. Treatment may also include:   Medicines to help you sleep.  Counseling or therapy.  Lifestyle adjustments. HOME CARE INSTRUCTIONS   Take medicines only as directed by your health care provider.  Keep regular sleeping and waking hours. Avoid naps.  Keep a sleep diary to help you and your health care provider figure out what could be causing your insomnia. Include:   When you sleep.  When you wake up during the night.  How well you sleep.   How rested you feel the next day.  Any side effects of medicines you are taking.  What you eat and drink.   Make your bedroom a comfortable place where it is easy to fall asleep:  Put up shades or special blackout curtains to block light from outside.  Use a white noise machine to block noise.  Keep the temperature cool.   Exercise regularly as directed by your health care provider. Avoid exercising right before bedtime.  Use relaxation techniques to manage stress. Ask your health care provider to suggest some techniques that may work well for you. These may include:  Breathing exercises.  Routines to release muscle tension.  Visualizing peaceful scenes.  Cut back on alcohol, caffeinated beverages, and cigarettes, especially close to bedtime. These can disrupt your sleep.  Do not overeat or eat spicy foods right before bedtime. This can lead to digestive discomfort that can make it hard for you to sleep.  Limit screen use before bedtime. This includes:  Watching TV.  Using your smartphone, tablet, and computer.  Stick to a routine. This   can help you fall asleep faster. Try to do a quiet activity, brush your teeth, and go to bed at the same time each night.  Get out of bed if you are still awake after 15 minutes of trying to sleep. Keep the lights down, but try reading or  doing a quiet activity. When you feel sleepy, go back to bed.  Make sure that you drive carefully. Avoid driving if you feel very sleepy.  Keep all follow-up appointments as directed by your health care provider. This is important. SEEK MEDICAL CARE IF:   You are tired throughout the day or have trouble in your daily routine due to sleepiness.  You continue to have sleep problems or your sleep problems get worse. SEEK IMMEDIATE MEDICAL CARE IF:   You have serious thoughts about hurting yourself or someone else.   This information is not intended to replace advice given to you by your health care provider. Make sure you discuss any questions you have with your health care provider.   Document Released: 11/26/2000 Document Revised: 08/20/2015 Document Reviewed: 08/30/2014 Elsevier Interactive Patient Education 2016 [employer]  
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
## 2016-02-03 ENCOUNTER — Telehealth: Payer: Self-pay | Admitting: Neurology

## 2016-02-03 DIAGNOSIS — G4730 Sleep apnea, unspecified: Secondary | ICD-10-CM

## 2016-02-03 DIAGNOSIS — R0683 Snoring: Secondary | ICD-10-CM

## 2016-02-03 DIAGNOSIS — G4710 Hypersomnia, unspecified: Secondary | ICD-10-CM

## 2016-02-03 DIAGNOSIS — G4700 Insomnia, unspecified: Secondary | ICD-10-CM

## 2016-02-03 NOTE — Telephone Encounter (Signed)
Patient called about her sleep study and I don't have an order for a sleep study.  Can I get an order?

## 2016-02-15 ENCOUNTER — Other Ambulatory Visit: Payer: Self-pay | Admitting: Internal Medicine

## 2016-02-24 ENCOUNTER — Other Ambulatory Visit: Payer: Self-pay

## 2016-02-24 DIAGNOSIS — G8929 Other chronic pain: Secondary | ICD-10-CM

## 2016-02-24 DIAGNOSIS — M542 Cervicalgia: Principal | ICD-10-CM

## 2016-02-24 NOTE — Telephone Encounter (Signed)
Pt req. Refill for   HYDROcodone-acetaminophen (NORCO/VICODIN) 5-325 MG tablet [[id number]][[id number]]      Last OV 08/2016   [phone] cb please

## 2016-02-24 NOTE — Telephone Encounter (Signed)
08/30/2015 

## 2016-02-26 ENCOUNTER — Other Ambulatory Visit: Payer: Self-pay | Admitting: Internal Medicine

## 2016-02-26 NOTE — Telephone Encounter (Signed)
What are her f/u plans Pended meds to sign tomorrow

## 2016-03-01 MED ORDER — HYDROCODONE-ACETAMINOPHEN 5-325 MG PO TABS: 1.0000 | ORAL_TABLET | Freq: Every evening | ORAL | Status: DC | PRN

## 2016-03-01 NOTE — Telephone Encounter (Signed)
Has she picked out a place to go when I retire yet? Meds ordered this encounter  Medications  . ALPRAZolam (XANAX) 0.25 MG tablet    Sig: TAKE 1 TABLET BY MOUTH THREE TIMES DAILY AS NEEDED FOR ANXIETY    Dispense:  90 tablet    Refill:  0  . traMADol (ULTRAM) 50 MG tablet    Sig: TAKE 1 TABLET BY MOUTH EVERY 6 HOURS AS NEEDED    Dispense:  120 tablet    Refill:  0

## 2016-03-02 NOTE — Telephone Encounter (Signed)
Faxed Rxs. Called pt who reported that she had not decided who she was going to see, but in talking with her she decided to try to come in here on a day that both Dr Merla Richesoolittle and Maralyn SagoSarah will be here. She is off this Fri and will plan to come in then.

## 2016-03-02 NOTE — Telephone Encounter (Signed)
See notes under 3/16 message. Called pt to notify Rx ready.

## 2016-03-05 ENCOUNTER — Ambulatory Visit (INDEPENDENT_AMBULATORY_CARE_PROVIDER_SITE_OTHER): Payer: BLUE CROSS/BLUE SHIELD | Admitting: Internal Medicine

## 2016-03-05 ENCOUNTER — Ambulatory Visit (INDEPENDENT_AMBULATORY_CARE_PROVIDER_SITE_OTHER): Payer: BLUE CROSS/BLUE SHIELD | Admitting: Neurology

## 2016-03-05 VITALS — BP 128/62 | HR 102 | Temp 98.1°F | Resp 16 | Ht 66.0 in | Wt 228.0 lb

## 2016-03-05 DIAGNOSIS — G4730 Sleep apnea, unspecified: Secondary | ICD-10-CM

## 2016-03-05 DIAGNOSIS — M549 Dorsalgia, unspecified: Secondary | ICD-10-CM

## 2016-03-05 DIAGNOSIS — I10 Essential (primary) hypertension: Secondary | ICD-10-CM

## 2016-03-05 DIAGNOSIS — G894 Chronic pain syndrome: Secondary | ICD-10-CM | POA: Diagnosis not present

## 2016-03-05 DIAGNOSIS — G4710 Hypersomnia, unspecified: Secondary | ICD-10-CM | POA: Diagnosis not present

## 2016-03-05 DIAGNOSIS — G8929 Other chronic pain: Secondary | ICD-10-CM

## 2016-03-05 DIAGNOSIS — R0683 Snoring: Secondary | ICD-10-CM

## 2016-03-05 DIAGNOSIS — M542 Cervicalgia: Secondary | ICD-10-CM

## 2016-03-05 DIAGNOSIS — G4700 Insomnia, unspecified: Secondary | ICD-10-CM

## 2016-03-05 LAB — CBC WITH DIFFERENTIAL/PLATELET
BASOS PCT: 0 % (ref 0–1)
Basophils Absolute: 0 10*3/uL (ref 0.0–0.1)
EOS PCT: 1 % (ref 0–5)
Eosinophils Absolute: 0.1 10*3/uL (ref 0.0–0.7)
HEMATOCRIT: 39.6 % (ref 36.0–46.0)
Hemoglobin: 13.6 g/dL (ref 12.0–15.0)
Lymphocytes Relative: 25 % (ref 12–46)
Lymphs Abs: 2.4 10*3/uL (ref 0.7–4.0)
MCH: 30.3 pg (ref 26.0–34.0)
MCHC: 34.3 g/dL (ref 30.0–36.0)
MCV: 88.2 fL (ref 78.0–100.0)
MONO ABS: 0.8 10*3/uL (ref 0.1–1.0)
MPV: 10.1 fL (ref 8.6–12.4)
Monocytes Relative: 9 % (ref 3–12)
Neutro Abs: 6.1 10*3/uL (ref 1.7–7.7)
Neutrophils Relative %: 65 % (ref 43–77)
Platelets: 268 10*3/uL (ref 150–400)
RBC: 4.49 MIL/uL (ref 3.87–5.11)
RDW: 13.4 % (ref 11.5–15.5)
WBC: 9.4 10*3/uL (ref 4.0–10.5)

## 2016-03-05 LAB — TSH: TSH: 4.22 mIU/L

## 2016-03-05 MED ORDER — SUMATRIPTAN SUCCINATE 100 MG PO TABS: 100.0000 mg | ORAL_TABLET | ORAL | Status: DC | PRN

## 2016-03-05 MED ORDER — DICYCLOMINE HCL 20 MG PO TABS: 20.0000 mg | ORAL_TABLET | Freq: Four times a day (QID) | ORAL | Status: AC

## 2016-03-05 MED ORDER — LISINOPRIL-HYDROCHLOROTHIAZIDE 20-12.5 MG PO TABS: 1.0000 | ORAL_TABLET | Freq: Every day | ORAL | Status: DC

## 2016-03-05 MED ORDER — ALPRAZOLAM 0.25 MG PO TABS: 0.2500 mg | ORAL_TABLET | Freq: Three times a day (TID) | ORAL | Status: DC | PRN

## 2016-03-05 MED ORDER — CYCLOBENZAPRINE HCL 10 MG PO TABS: 10.0000 mg | ORAL_TABLET | Freq: Three times a day (TID) | ORAL | Status: DC

## 2016-03-05 MED ORDER — PROMETHAZINE HCL 12.5 MG PO TABS: 12.5000 mg | ORAL_TABLET | Freq: Four times a day (QID) | ORAL | Status: DC | PRN

## 2016-03-05 MED ORDER — HYDROCODONE-ACETAMINOPHEN 5-325 MG PO TABS: 1.0000 | ORAL_TABLET | Freq: Every evening | ORAL | Status: DC | PRN

## 2016-03-05 MED ORDER — SPIRONOLACTONE 25 MG PO TABS: ORAL_TABLET | ORAL | Status: DC

## 2016-03-05 MED ORDER — MELOXICAM 7.5 MG PO TABS: 7.5000 mg | ORAL_TABLET | Freq: Two times a day (BID) | ORAL | Status: DC

## 2016-03-05 MED ORDER — PAROXETINE HCL 20 MG PO TABS: 20.0000 mg | ORAL_TABLET | Freq: Every morning | ORAL | Status: DC

## 2016-03-05 MED ORDER — TRAMADOL HCL 50 MG PO TABS: 50.0000 mg | ORAL_TABLET | Freq: Four times a day (QID) | ORAL | Status: DC | PRN

## 2016-03-05 NOTE — Patient Instructions (Signed)
     IF you received an x-ray today, you will receive an invoice from [facility]. Please contact [facility] at [phone] with questions or concerns regarding your invoice.   IF you received labwork today, you will receive an invoice from [facility]. Please contact [facility] at [phone] with questions or concerns regarding your invoice.   Our billing staff will not be able to assist you with questions regarding bills from these companies.  You will be contacted with the lab results as soon as they are available. The fastest way to get your results is to activate your My Chart account. Instructions are located on the last page of this paperwork. If you have not heard from us regarding the results in 2 weeks, please contact this office.      
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
## 2016-03-06 LAB — COMPREHENSIVE METABOLIC PANEL
ALBUMIN: 4.8 g/dL (ref 3.6–5.1)
ALK PHOS: 77 U/L (ref 33–115)
ALT: 30 U/L — ABNORMAL HIGH (ref 6–29)
AST: 29 U/L (ref 10–35)
BUN: 9 mg/dL (ref 7–25)
CALCIUM: 9.7 mg/dL (ref 8.6–10.2)
CHLORIDE: 97 mmol/L — AB (ref 98–110)
CO2: 30 mmol/L (ref 20–31)
Creat: 0.76 mg/dL (ref 0.50–1.10)
Glucose, Bld: 86 mg/dL (ref 65–99)
POTASSIUM: 3.4 mmol/L — AB (ref 3.5–5.3)
SODIUM: 143 mmol/L (ref 135–146)
TOTAL PROTEIN: 7.5 g/dL (ref 6.1–8.1)
Total Bilirubin: 0.4 mg/dL (ref 0.2–1.2)

## 2016-03-06 LAB — LIPID PANEL
CHOL/HDL RATIO: 2.8 ratio (ref <=5.0)
CHOLESTEROL: 159 mg/dL (ref 125–200)
HDL: 57 mg/dL (ref >=46)
LDL Cholesterol: 62 mg/dL (ref <130)
TRIGLYCERIDES: 198 mg/dL — AB (ref <150)
VLDL: 40 mg/dL — AB (ref <30)

## 2016-03-06 NOTE — Sleep Study (Signed)
Please see the scanned sleep study interpretation located in the procedure tab within the chart review section.   

## 2016-03-07 ENCOUNTER — Encounter: Payer: Self-pay | Admitting: Internal Medicine

## 2016-03-11 ENCOUNTER — Telehealth: Payer: Self-pay

## 2016-03-11 DIAGNOSIS — G4733 Obstructive sleep apnea (adult) (pediatric): Secondary | ICD-10-CM

## 2016-03-11 NOTE — Telephone Encounter (Signed)
I called pt to discuss sleep study results. No answer, left a message asking her to call me back. If pt calls back on Friday, please advise her that I am out of the office and will call her back on Monday.

## 2016-03-15 NOTE — Telephone Encounter (Signed)
Patient is calling back about her sleep study results. Please call back [phone]. Thanks!

## 2016-03-15 NOTE — Telephone Encounter (Signed)
Spoke to pt and advised her that her sleep study results showed sleep apnea with hypercapnia and hypoxia. Treatment is advised and CPAP is indicated due to the hypoxia and CO2 retention. Dr. Vickey Hugerohmeier recommends proceedingwith a cpap and if needed and oxygen titration to optimize therapy. I advised weight loss and positional therapy. I also advised pt to avoid sedative hypnotics, alcohol, and tobacco. Pt verbalized understanding.

## 2016-03-15 NOTE — Telephone Encounter (Signed)
Patient called back and stated her number is actually [phone].

## 2016-03-16 NOTE — Telephone Encounter (Signed)
Pt said she forgot to ask how many times she stopped breathing during sleep study [facility]. I skyped [facility] HireKristen and she said almost 13 times per hour on average .

## 2016-04-07 ENCOUNTER — Ambulatory Visit (INDEPENDENT_AMBULATORY_CARE_PROVIDER_SITE_OTHER): Payer: BLUE CROSS/BLUE SHIELD | Admitting: Neurology

## 2016-04-07 DIAGNOSIS — G4733 Obstructive sleep apnea (adult) (pediatric): Secondary | ICD-10-CM

## 2016-04-13 ENCOUNTER — Telehealth: Payer: Self-pay

## 2016-04-13 DIAGNOSIS — G4733 Obstructive sleep apnea (adult) (pediatric): Secondary | ICD-10-CM

## 2016-04-13 NOTE — Telephone Encounter (Signed)
I called pt to discuss her sleep study results. No answer, left a message asking her to call me back. 

## 2016-04-14 NOTE — Telephone Encounter (Signed)
Spoke to pt regarding her sleep study results. I advised her that her cpap titration appeared to be effective in treating pt's apnea. Dr. Vickey Hugerohmeier recommends starting cpap. Pt is very agreeable, and states, "yay! I'm so excited! You made my day! I can't wait!" I advised her that I would send the order for cpap to a DME and they would call her to set it up. I advised pt to avoid caffeine containing beverages and chocolate. A follow up appt was made for 7/19 at 2:30. Pt verbalized understanding. Will send to [facility].

## 2016-04-24 ENCOUNTER — Other Ambulatory Visit: Payer: Self-pay | Admitting: Internal Medicine

## 2016-04-26 NOTE — Telephone Encounter (Signed)
Dr Merla Richesoolittle, you Rxd this for pt for edema in March and put one RF on it. I wasn't sure if you would want to give more RFs or if pt needs to f/up?

## 2016-06-18 ENCOUNTER — Other Ambulatory Visit: Payer: Self-pay | Admitting: Internal Medicine

## 2016-06-19 ENCOUNTER — Telehealth: Payer: Self-pay

## 2016-06-19 NOTE — Telephone Encounter (Signed)
Left a VM for patient to inform her that her RX for tramadol refill had been called in for her.

## 2016-06-30 ENCOUNTER — Ambulatory Visit (INDEPENDENT_AMBULATORY_CARE_PROVIDER_SITE_OTHER): Payer: BLUE CROSS/BLUE SHIELD | Admitting: Neurology

## 2016-06-30 ENCOUNTER — Encounter: Payer: Self-pay | Admitting: Neurology

## 2016-06-30 ENCOUNTER — Telehealth: Payer: Self-pay

## 2016-06-30 VITALS — BP 130/82 | HR 86 | Resp 20 | Ht 67.5 in | Wt 234.0 lb

## 2016-06-30 DIAGNOSIS — G4733 Obstructive sleep apnea (adult) (pediatric): Secondary | ICD-10-CM | POA: Diagnosis not present

## 2016-06-30 DIAGNOSIS — G894 Chronic pain syndrome: Secondary | ICD-10-CM

## 2016-06-30 DIAGNOSIS — Z9989 Dependence on other enabling machines and devices: Principal | ICD-10-CM

## 2016-06-30 DIAGNOSIS — G4736 Sleep related hypoventilation in conditions classified elsewhere: Secondary | ICD-10-CM

## 2016-06-30 NOTE — Telephone Encounter (Signed)
Patient having trouble with her nasal pillows. Gave her and F&P nasal mask to try while she was at her office visit. She will let me know if this helps.
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
## 2016-08-02 ENCOUNTER — Other Ambulatory Visit: Payer: Self-pay

## 2016-08-02 NOTE — Telephone Encounter (Signed)
Pharm req's RF of tramadol. Last Rf 7/8, last OV 3/24.

## 2016-08-02 NOTE — Telephone Encounter (Signed)
Polypharmacy.  RTC. Deliah BostonMichael Nkosi Cortright, MS, PA-C 6:31 PM, 08/02/2016

## 2016-08-06 NOTE — Telephone Encounter (Addendum)
Tried to call pt to advise she will need to RTC to est care w/new provider and discuss her medications now that Dr Merla Richesoolittle has retired in order to get RFs, but her VM has not been set up yet. Please give her this message if she calls back.

## 2016-12-17 ENCOUNTER — Emergency Department
Admission: EM | Admit: 2016-12-17 | Discharge: 2016-12-17 | Disposition: A | Discharge status: Home / Self Care | Payer: BLUE CROSS/BLUE SHIELD | Attending: Emergency Medicine | Admitting: Emergency Medicine

## 2016-12-17 ENCOUNTER — Emergency Department: Payer: BLUE CROSS/BLUE SHIELD

## 2016-12-17 ENCOUNTER — Encounter: Payer: Self-pay | Admitting: Emergency Medicine

## 2016-12-17 DIAGNOSIS — R05 Cough: Secondary | ICD-10-CM | POA: Diagnosis present

## 2016-12-17 DIAGNOSIS — J101 Influenza due to other identified influenza virus with other respiratory manifestations: Secondary | ICD-10-CM

## 2016-12-17 DIAGNOSIS — J09X2 Influenza due to identified novel influenza A virus with other respiratory manifestations: Secondary | ICD-10-CM | POA: Diagnosis not present

## 2016-12-17 DIAGNOSIS — Z79899 Other long term (current) drug therapy: Secondary | ICD-10-CM | POA: Insufficient documentation

## 2016-12-17 DIAGNOSIS — I10 Essential (primary) hypertension: Secondary | ICD-10-CM | POA: Insufficient documentation

## 2016-12-17 DIAGNOSIS — F419 Anxiety disorder, unspecified: Secondary | ICD-10-CM | POA: Diagnosis not present

## 2016-12-17 MED ORDER — KETOROLAC TROMETHAMINE 30 MG/ML IJ SOLN: 30.0000 mg | Freq: Once | INTRAMUSCULAR | Status: DC

## 2016-12-17 MED ORDER — DIAZEPAM 5 MG PO TABS: 5.0000 mg | ORAL_TABLET | Freq: Three times a day (TID) | ORAL | 0 refills | Status: DC | PRN

## 2016-12-17 MED ORDER — IBUPROFEN 800 MG PO TABS: 800.0000 mg | ORAL_TABLET | Freq: Three times a day (TID) | ORAL | 0 refills | Status: DC | PRN

## 2016-12-17 MED ORDER — HYDROCOD POLST-CPM POLST ER 10-8 MG/5ML PO SUER: 5.0000 mL | Freq: Two times a day (BID) | ORAL | 0 refills | Status: DC

## 2016-12-17 MED ORDER — IBUPROFEN 800 MG PO TABS
800.0000 mg | ORAL_TABLET | Freq: Once | ORAL | Status: DC
  Filled 2016-12-17: qty 1

## 2016-12-17 MED ORDER — ONDANSETRON 4 MG PO TBDP
4.0000 mg | ORAL_TABLET | Freq: Once | ORAL | Status: AC
  Administered 2016-12-17: 4 mg via ORAL
  Filled 2016-12-17: qty 1

## 2016-12-17 MED ORDER — HYDROCOD POLST-CPM POLST ER 10-8 MG/5ML PO SUER
5.0000 mL | Freq: Once | ORAL | Status: AC
  Administered 2016-12-17: 5 mL via ORAL
  Filled 2016-12-17: qty 5

## 2016-12-17 MED ORDER — DIAZEPAM 5 MG PO TABS
10.0000 mg | ORAL_TABLET | Freq: Once | ORAL | Status: AC
Start: 2016-12-17 — End: 2016-12-17
  Administered 2016-12-17: 10 mg via ORAL
  Filled 2016-12-17: qty 2

## 2016-12-17 NOTE — ED Notes (Signed)
Pt c/o cough, congestion, neck pain and tightness in chest since Wednesday. Reports boss was sick with flu like sx. Coughing in room. Also c/o headache.

## 2016-12-17 NOTE — ED Triage Notes (Signed)
Patient states that she has had fever, cough, congestion, and nausea. Patient states that she is also having difficulty breathing and tightness in her chest.   Patient reports extensive PMH. Patient states that she has been having neck pain and numbness in her right arm that started yesterday.

## 2016-12-17 NOTE — ED Provider Notes (Signed)
[facility] Emergency Department Provider Note        Time seen: ----------------------------------------- 9:53 AM on 12/17/2016 -----------------------------------------    I have reviewed the triage vital signs and the nursing notes.   HISTORY  Chief Complaint No chief complaint on file.    HPI Carolyn Allen is a 51 y.o. female who presents to ER for fever, cough, congestion and nausea. Patient states having difficulty breathing and tightness in her chest. She reports symptom onset around Wednesday, nothing has made it better or worse. She's having pain in her neck and numbness in her right arm   Past Medical History:  Diagnosis Date  . Allergy   . Arthritis   . Back pain   . Depression   . Heart murmur   . HSV (herpes simplex virus) infection   . Hypertension   . Migraines   . Mitral valve prolapse   . PONV (postoperative nausea and vomiting)   . TMJ (temporomandibular joint syndrome)     Patient Active Problem List   Diagnosis Date Noted  . Sleep related hypoventilation/hypoxemia in other disease 06/30/2016  . OSA on CPAP 06/30/2016  . Snoring 01/27/2016  . Hypersomnia with sleep apnea 01/27/2016  . Morbid obesity due to excess calories (HCC) 01/27/2016  . Insomnia with sleep apnea 01/27/2016  . Chronic pain syndrome 01/27/2016  . Mitral valve prolapse 12/18/2014  . Unspecified constipation 01/08/2014  . HTN (hypertension) 04/19/2012  . Migraine headache 04/19/2012  . IBS (irritable bowel syndrome) 04/19/2012  . GAD (generalized anxiety disorder) 04/19/2012  . Depression 04/19/2012  . AR (allergic rhinitis) 04/19/2012  . Chronic back pain 04/19/2012  . Chronic neck pain 04/19/2012    Past Surgical History:  Procedure Laterality Date  . ABDOMINAL HYSTERECTOMY    . CYSTECTOMY    . JOINT REPLACEMENT     both knees  . SHOULDER SURGERY    . SPINAL CORD STIMULATOR INSERTION N/A 10/08/2013   Procedure: LUMBAR SPINAL CORD  STIMULATOR INSERTION;  Surgeon: Venita Lick, MD;  Location: [facility] OR;  Service: Orthopedics;  Laterality: N/A;  . SPINE SURGERY    . TMJ ARTHROPLASTY      Allergies Oxycodone and Codeine  Social History Social History  Substance Use Topics  . Smoking status: Never Smoker  . Smokeless tobacco: Never Used  . Alcohol use No    Review of Systems Constitutional: Positive for fever Cardiovascular: Negative for chest pain. Respiratory: Positive for cough Gastrointestinal: Negative for abdominal pain, positive for nausea Genitourinary: Negative for dysuria. Musculoskeletal: positive for neck and back pain Skin: Negative for rash. Neurological: Negative for headaches, focal weakness or numbness.  10-point ROS otherwise negative.  ____________________________________________   PHYSICAL EXAM:  VITAL SIGNS: ED Triage Vitals  Enc Vitals Group     BP 12/17/16 0935 (!) 148/89     Pulse Rate 12/17/16 0935 90     Resp 12/17/16 0935 16     Temp 12/17/16 0935 99.2 F (37.3 C)     Temp Source 12/17/16 0935 Oral     SpO2 12/17/16 0935 97 %     Weight 12/17/16 0936 230 lb (104.3 kg)     Height 12/17/16 0936 5\' 7"  (1.702 m)     Head Circumference --      Peak Flow --      Pain Score 12/17/16 0936 10     Pain Loc --      Pain Edu? --      Excl. in GC? --  Constitutional: Alert and oriented. Well appearing and in no distress. Eyes: Conjunctivae are normal. PERRL. Normal extraocular movements. ENT   Head: Normocephalic and atraumatic.   Nose: No congestion/rhinnorhea.   Mouth/Throat: Mucous membranes are moist.   Neck: No stridor. Cardiovascular: Normal rate, regular rhythm. No murmurs, rubs, or gallops. Respiratory: Normal respiratory effort without tachypnea nor retractions. Breath sounds are clear and equal bilaterally. No wheezes/rales/rhonchi. Gastrointestinal: Soft and nontender. Normal bowel sounds Musculoskeletal: Nontender with normal range of motion in all  extremities. No lower extremity tenderness nor edema. Neurologic:  Normal speech and language. No gross focal neurologic deficits are appreciated.  Skin:  Skin is warm, dry and intact. No rash noted. Psychiatric: Mood and affect are normal. Speech and behavior are normal.  ____________________________________________  ED COURSE:  Pertinent labs & imaging results that were available during my care of the patient were reviewed by me and considered in my medical decision making (see chart for details). Clinical Course   Patient presents to the ER with influenza and generalized complaints consistent with same. We will assess with chest x-ray, give oral medicine. There is also an anxiety component.  Procedures ____________________________________________   RADIOLOGY Images were viewed by me  Chest x-ray IMPRESSION: 1. Mild cardiomegaly. No evidence of pulmonary venous congestion.  2. Low lung volumes. ____________________________________________  FINAL ASSESSMENT AND PLAN  Influenza, anxiety  Plan: Patient with imaging as dictated above. Patient is in no acute distress, she'll be discharged with medications for anxiety, cough and pain. She is stable for outpatient follow-up with her doctor.   Emily FilbertWilliams, Jonathan E, MD   Note: This dictation was prepared with Dragon dictation. Any transcriptional errors that result from this process are unintentional    Emily FilbertJonathan E Williams, MD 12/17/16 1059

## 2016-12-17 NOTE — ED Notes (Signed)
Patient transported to X-ray 

## 2017-01-12 ENCOUNTER — Other Ambulatory Visit: Payer: Self-pay

## 2017-01-12 MED ORDER — LISINOPRIL-HYDROCHLOROTHIAZIDE 20-12.5 MG PO TABS: 1.0000 | ORAL_TABLET | Freq: Every day | ORAL | 0 refills | Status: DC

## 2017-01-12 NOTE — Telephone Encounter (Signed)
Fax req. For lisinopril - pt needs appt.  Spoke with pt. She just needs enough lisinopril x 10 tabs to get pt to appt w/[facility] PCP Sent Meloxicam denied

## 2017-06-30 ENCOUNTER — Ambulatory Visit: Payer: BLUE CROSS/BLUE SHIELD | Admitting: Adult Health

## 2018-06-14 ENCOUNTER — Other Ambulatory Visit: Payer: Self-pay | Admitting: Student

## 2018-06-14 DIAGNOSIS — M542 Cervicalgia: Secondary | ICD-10-CM

## 2018-06-29 ENCOUNTER — Ambulatory Visit
Admission: RE | Admit: 2018-06-29 | Discharge: 2018-06-29 | Disposition: A | Discharge status: Home / Self Care | Payer: BLUE CROSS/BLUE SHIELD | Source: Ambulatory Visit | Attending: Student | Admitting: Student

## 2018-06-29 VITALS — BP 149/90 | HR 70

## 2018-06-29 DIAGNOSIS — M542 Cervicalgia: Secondary | ICD-10-CM

## 2018-06-29 DIAGNOSIS — G8929 Other chronic pain: Secondary | ICD-10-CM

## 2018-06-29 MED ORDER — MEPERIDINE HCL 100 MG/ML IJ SOLN
75.0000 mg | Freq: Once | INTRAMUSCULAR | Status: AC
Start: 2018-06-29 — End: 2018-06-29
  Administered 2018-06-29: 75 mg via INTRAMUSCULAR

## 2018-06-29 MED ORDER — IOPAMIDOL (ISOVUE-M 300) INJECTION 61%
10.0000 mL | Freq: Once | INTRAMUSCULAR | Status: AC | PRN
  Administered 2018-06-29: 10 mL via INTRATHECAL

## 2018-06-29 MED ORDER — DIAZEPAM 5 MG PO TABS
10.0000 mg | ORAL_TABLET | Freq: Once | ORAL | Status: AC
  Administered 2018-06-29: 10 mg via ORAL

## 2018-06-29 MED ORDER — HYDROXYZINE HCL 50 MG/ML IM SOLN
25.0000 mg | Freq: Once | INTRAMUSCULAR | Status: AC
  Administered 2018-06-29: 25 mg via INTRAMUSCULAR

## 2018-06-29 MED ORDER — ONDANSETRON HCL 4 MG/2ML IJ SOLN: 4.0000 mg | Freq: Four times a day (QID) | INTRAMUSCULAR | Status: DC | PRN

## 2018-06-29 NOTE — Discharge Instructions (Signed)
Myelogram Discharge Instructions  1. Go home and rest quietly for the next 24 hours.  It is important to lie flat for the next 24 hours.  Get up only to go to the restroom.  You may lie in the bed or on a couch on your back, your stomach, your left side or your right side.  You may have one pillow under your head.  You may have pillows between your knees while you are on your side or under your knees while you are on your back.  2. DO NOT drive today.  Recline the seat as far back as it will go, while still wearing your seat belt, on the way home.  3. You may get up to go to the bathroom as needed.  You may sit up for 10 minutes to eat.  You may resume your normal diet and medications unless otherwise indicated.  Drink lots of extra fluids today and tomorrow.  4. The incidence of headache, nausea, or vomiting is about 5% (one in 20 patients).  If you develop a headache, lie flat and drink plenty of fluids until the headache goes away.  Caffeinated beverages may be helpful.  If you develop severe nausea and vomiting or a headache that does not go away with flat bed rest, call [phone].  5. You may resume normal activities after your 24 hours of bed rest is over; however, do not exert yourself strongly or do any heavy lifting tomorrow. If when you get up you have a headache when standing, go back to bed and force fluids for another 24 hours.  6. Call your physician for a follow-up appointment.  The results of your myelogram will be sent directly to your physician by the following day.  7. If you have any questions or if complications develop after you arrive home, please call [phone].  Discharge instructions have been explained to the patient.  The patient, or the person responsible for the patient, fully understands these instructions.  YOU MAY RESTART YOUR PHENERGAN, IMITREX, TRAMADOL AND PAXIL TOMORROW 06/30/2018 AT 10:30AM.

## 2018-06-29 NOTE — Progress Notes (Signed)
Patient states she has been off Imitrex, Paxil, Phenergan and Tramadol for at least the past two days.  Larina EarthlyJ. Cambren Helm, RN

## 2018-08-01 ENCOUNTER — Emergency Department
Admission: EM | Admit: 2018-08-01 | Discharge: 2018-08-01 | Disposition: A | Discharge status: Home / Self Care | Payer: BLUE CROSS/BLUE SHIELD | Attending: Emergency Medicine | Admitting: Emergency Medicine

## 2018-08-01 ENCOUNTER — Other Ambulatory Visit: Payer: Self-pay

## 2018-08-01 ENCOUNTER — Encounter: Payer: Self-pay | Admitting: Emergency Medicine

## 2018-08-01 ENCOUNTER — Emergency Department: Payer: BLUE CROSS/BLUE SHIELD

## 2018-08-01 DIAGNOSIS — R51 Headache: Secondary | ICD-10-CM | POA: Insufficient documentation

## 2018-08-01 DIAGNOSIS — F329 Major depressive disorder, single episode, unspecified: Secondary | ICD-10-CM | POA: Insufficient documentation

## 2018-08-01 DIAGNOSIS — S299XXA Unspecified injury of thorax, initial encounter: Secondary | ICD-10-CM | POA: Diagnosis present

## 2018-08-01 DIAGNOSIS — S161XXA Strain of muscle, fascia and tendon at neck level, initial encounter: Secondary | ICD-10-CM | POA: Diagnosis not present

## 2018-08-01 DIAGNOSIS — I10 Essential (primary) hypertension: Secondary | ICD-10-CM | POA: Diagnosis not present

## 2018-08-01 DIAGNOSIS — Y92410 Unspecified street and highway as the place of occurrence of the external cause: Secondary | ICD-10-CM | POA: Insufficient documentation

## 2018-08-01 DIAGNOSIS — S2001XA Contusion of right breast, initial encounter: Secondary | ICD-10-CM | POA: Insufficient documentation

## 2018-08-01 DIAGNOSIS — Y998 Other external cause status: Secondary | ICD-10-CM | POA: Diagnosis not present

## 2018-08-01 DIAGNOSIS — Z79899 Other long term (current) drug therapy: Secondary | ICD-10-CM | POA: Diagnosis not present

## 2018-08-01 DIAGNOSIS — S39012A Strain of muscle, fascia and tendon of lower back, initial encounter: Secondary | ICD-10-CM | POA: Diagnosis not present

## 2018-08-01 DIAGNOSIS — Z96653 Presence of artificial knee joint, bilateral: Secondary | ICD-10-CM | POA: Insufficient documentation

## 2018-08-01 DIAGNOSIS — M7918 Myalgia, other site: Secondary | ICD-10-CM | POA: Insufficient documentation

## 2018-08-01 DIAGNOSIS — Y9389 Activity, other specified: Secondary | ICD-10-CM | POA: Diagnosis not present

## 2018-08-01 MED ORDER — HYDROCODONE-ACETAMINOPHEN 5-325 MG PO TABS: 1.0000 | ORAL_TABLET | Freq: Four times a day (QID) | ORAL | 0 refills | Status: DC | PRN

## 2018-08-01 MED ORDER — CYCLOBENZAPRINE HCL 10 MG PO TABS: 10.0000 mg | ORAL_TABLET | Freq: Three times a day (TID) | ORAL | 0 refills | Status: AC | PRN

## 2018-08-01 MED ORDER — CYCLOBENZAPRINE HCL 10 MG PO TABS
10.0000 mg | ORAL_TABLET | Freq: Once | ORAL | Status: AC
  Administered 2018-08-01: 10 mg via ORAL
  Filled 2018-08-01: qty 1

## 2018-08-01 MED ORDER — HYDROCODONE-ACETAMINOPHEN 7.5-325 MG/15ML PO SOLN
10.0000 mL | Freq: Once | ORAL | Status: AC
  Administered 2018-08-01: 10 mL via ORAL
  Filled 2018-08-01: qty 15

## 2018-08-01 NOTE — Discharge Instructions (Addendum)
Advised take medication as directed.  Advised to not drive while taking these medications.  Advised to follow-up with PCP to schedule ultrasound of your breast in 2 weeks.
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
## 2018-08-14 ENCOUNTER — Encounter (HOSPITAL_COMMUNITY): Payer: Self-pay | Admitting: Emergency Medicine

## 2018-08-14 ENCOUNTER — Other Ambulatory Visit: Payer: Self-pay

## 2018-08-14 ENCOUNTER — Emergency Department (HOSPITAL_COMMUNITY): Payer: BLUE CROSS/BLUE SHIELD

## 2018-08-14 ENCOUNTER — Emergency Department (HOSPITAL_COMMUNITY)
Admission: EM | Admit: 2018-08-14 | Discharge: 2018-08-14 | Disposition: A | Discharge status: Home / Self Care | Payer: BLUE CROSS/BLUE SHIELD | Attending: Emergency Medicine | Admitting: Emergency Medicine

## 2018-08-14 DIAGNOSIS — Z96652 Presence of left artificial knee joint: Secondary | ICD-10-CM | POA: Insufficient documentation

## 2018-08-14 DIAGNOSIS — S62211A Bennett's fracture, right hand, initial encounter for closed fracture: Secondary | ICD-10-CM

## 2018-08-14 DIAGNOSIS — M25531 Pain in right wrist: Secondary | ICD-10-CM | POA: Diagnosis not present

## 2018-08-14 DIAGNOSIS — M25562 Pain in left knee: Secondary | ICD-10-CM | POA: Diagnosis not present

## 2018-08-14 DIAGNOSIS — Z96651 Presence of right artificial knee joint: Secondary | ICD-10-CM | POA: Diagnosis not present

## 2018-08-14 DIAGNOSIS — Y929 Unspecified place or not applicable: Secondary | ICD-10-CM | POA: Insufficient documentation

## 2018-08-14 DIAGNOSIS — M7918 Myalgia, other site: Secondary | ICD-10-CM | POA: Diagnosis not present

## 2018-08-14 DIAGNOSIS — Z79899 Other long term (current) drug therapy: Secondary | ICD-10-CM | POA: Insufficient documentation

## 2018-08-14 DIAGNOSIS — Y998 Other external cause status: Secondary | ICD-10-CM | POA: Diagnosis not present

## 2018-08-14 DIAGNOSIS — S6991XA Unspecified injury of right wrist, hand and finger(s), initial encounter: Secondary | ICD-10-CM | POA: Diagnosis present

## 2018-08-14 DIAGNOSIS — Y9389 Activity, other specified: Secondary | ICD-10-CM | POA: Diagnosis not present

## 2018-08-14 DIAGNOSIS — M25561 Pain in right knee: Secondary | ICD-10-CM | POA: Insufficient documentation

## 2018-08-14 DIAGNOSIS — I10 Essential (primary) hypertension: Secondary | ICD-10-CM | POA: Diagnosis not present

## 2018-08-14 DIAGNOSIS — R079 Chest pain, unspecified: Secondary | ICD-10-CM | POA: Insufficient documentation

## 2018-08-14 MED ORDER — HYDROCODONE-ACETAMINOPHEN 5-325 MG PO TABS
1.0000 | ORAL_TABLET | Freq: Once | ORAL | Status: AC
  Administered 2018-08-14: 1 via ORAL
  Filled 2018-08-14: qty 1

## 2018-08-14 NOTE — ED Notes (Signed)
Pt verbalized understanding of discharge instructions and denies any further questions at this time.   

## 2018-08-14 NOTE — Progress Notes (Signed)
Orthopedic Tech Progress Note Patient Details:  Carolyn Allen 11-23-1966 [id number]  Ortho Devices Type of Ortho Device: Thumb velcro splint Ortho Device/Splint Interventions: Application   Post Interventions Patient Tolerated: Well Instructions Provided: Care of device   Saul Fordyce 08/14/2018, 2:52 PM

## 2018-08-14 NOTE — Discharge Instructions (Addendum)
Please read attached information. If you experience any new or worsening signs or symptoms please return to the emergency room for evaluation. Please follow-up with your primary care provider or specialist as discussed.  °
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
## 2018-08-14 NOTE — ED Notes (Addendum)
Dr Gramig at bedside. 

## 2018-08-14 NOTE — ED Notes (Signed)
Patient transported to X-ray 

## 2018-08-14 NOTE — ED Notes (Signed)
ED Provider at bedside. 
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
## 2019-04-10 ENCOUNTER — Inpatient Hospital Stay: Admission: RE | Admit: 2019-04-10 | Payer: BLUE CROSS/BLUE SHIELD | Source: Ambulatory Visit

## 2019-04-19 ENCOUNTER — Ambulatory Visit: Admit: 2019-04-19 | Payer: BLUE CROSS/BLUE SHIELD | Admitting: Surgery

## 2019-04-19 SURGERY — ARTHROSCOPY, KNEE
Anesthesia: Choice | Laterality: Left

## 2019-10-24 ENCOUNTER — Other Ambulatory Visit: Payer: Self-pay | Admitting: Surgery

## 2019-11-15 ENCOUNTER — Other Ambulatory Visit
Admission: RE | Admit: 2019-11-15 | Discharge: 2019-11-15 | Disposition: A | Discharge status: Home / Self Care | Payer: BLUE CROSS/BLUE SHIELD | Source: Ambulatory Visit | Attending: Surgery | Admitting: Surgery

## 2019-11-15 ENCOUNTER — Other Ambulatory Visit: Payer: Self-pay

## 2019-11-15 DIAGNOSIS — I10 Essential (primary) hypertension: Secondary | ICD-10-CM | POA: Insufficient documentation

## 2019-11-15 HISTORY — DX: Gastro-esophageal reflux disease without esophagitis: K21.9

## 2019-11-15 HISTORY — DX: Sleep apnea, unspecified: G47.30

## 2019-11-15 LAB — CBC
HCT: 38 % (ref 36.0–46.0)
Hemoglobin: 13.2 g/dL (ref 12.0–15.0)
MCH: 30.7 pg (ref 26.0–34.0)
MCHC: 34.7 g/dL (ref 30.0–36.0)
MCV: 88.4 fL (ref 80.0–100.0)
Platelets: 269 10*3/uL (ref 150–400)
RBC: 4.3 MIL/uL (ref 3.87–5.11)
RDW: 12.3 % (ref 11.5–15.5)
WBC: 5.9 10*3/uL (ref 4.0–10.5)
nRBC: 0 % (ref 0.0–0.2)

## 2019-11-15 LAB — COMPREHENSIVE METABOLIC PANEL
ALT: 17 U/L (ref 0–44)
AST: 15 U/L (ref 15–41)
Albumin: 4.3 g/dL (ref 3.5–5.0)
Alkaline Phosphatase: 79 U/L (ref 38–126)
Anion gap: 9 (ref 5–15)
BUN: 21 mg/dL — ABNORMAL HIGH (ref 6–20)
CO2: 26 mmol/L (ref 22–32)
Calcium: 9.4 mg/dL (ref 8.9–10.3)
Chloride: 102 mmol/L (ref 98–111)
Creatinine, Ser: 0.63 mg/dL (ref 0.44–1.00)
GFR calc Af Amer: >60 mL/min (ref >60)
GFR calc non Af Amer: >60 mL/min (ref >60)
Glucose, Bld: 106 mg/dL — ABNORMAL HIGH (ref 70–99)
Potassium: 3.7 mmol/L (ref 3.5–5.1)
Sodium: 137 mmol/L (ref 135–145)
Total Bilirubin: 0.6 mg/dL (ref 0.3–1.2)
Total Protein: 7.3 g/dL (ref 6.5–8.1)

## 2019-11-15 NOTE — Patient Instructions (Signed)
Your procedure is scheduled on: 11-21-19 [facility] Report to Same Day Surgery 2nd floor medical mall [facility] Entrance-take elevator on left to 2nd floor.  Check in with surgery information desk.) To find out your arrival time please call [phone] between 1PM - 3PM on 11-20-19 TUESDAY  Remember: Instructions that are not followed completely may result in serious medical risk, up to and including death, or upon the discretion of your surgeon and anesthesiologist your surgery may need to be rescheduled.    _x___ 1. Do not eat food after midnight the night before your procedure. NO GUM OR CANDY AFTER MIDNIGHT. You may drink clear liquids up to 2 hours before you are scheduled to arrive at the hospital for your procedure.  Do not drink clear liquids within 2 hours of your scheduled arrival to the hospital.  Clear liquids include  --Water or Apple juice without pulp  --Gatorade  --Black Coffee or Clear Tea (No milk, no creamers, do not add anything to the coffee or Tea Type 1 and type 2 diabetics should only drink water.   ____Ensure clear carbohydrate drink on the way to the hospital for bariatric patients  _X___Ensure clear carbohydrate drink 3 hours prior to arrival time    __x__ 2. No Alcohol for 24 hours before or after surgery.   __x__3. No Smoking or e-cigarettes for 24 prior to surgery.  Do not use any chewable tobacco products for at least 6 hour prior to surgery   ____  4. Bring all medications with you on the day of surgery if instructed.    __x__ 5. Notify your doctor if there is any change in your medical condition     (cold, fever, infections).    x___6. On the morning of surgery brush your teeth with toothpaste and water.  You may rinse your mouth with mouth wash if you wish.  Do not swallow any toothpaste or mouthwash.   Do not wear jewelry, make-up, hairpins, clips or nail polish.  Do not wear lotions, powders, or perfumes.   Do not shave 48 hours prior to  surgery. Men may shave face and neck.  Do not bring valuables to the hospital.    [facility] is not responsible for any belongings or valuables.               Contacts, dentures or bridgework may not be worn into surgery.  Leave your suitcase in the car. After surgery it may be brought to your room.  For patients admitted to the hospital, discharge time is determined by your  treatment team.  _  Patients discharged the day of surgery will not be allowed to drive home.  You will need someone to drive you home and stay with you the night of your procedure.    Please read over the following fact sheets that you were given:   [facility] Preparing for Surgery   _x___ TAKE THE FOLLOWING MEDICATION THE MORNING OF SURGERY WITH A SMALL SIP OF WATER. These include:  1. NORVASC (AMLODIPINE)  2. FLEXERIL (CYCLOBENAZEPRINE)  3. CYMBALTA (DULOXETINE)  4. PAXIL (PAROXETINE)  5. YOU MAY TAKE YOUR XANAX (ALPRAZOLAM)AM OF SURGERY IF NEEDED  6.  ____Fleets enema or Magnesium Citrate as directed.   _x___ Use CHG Soap or sage wipes as directed on instruction sheet   ____ Use inhalers on the day of surgery and bring to hospital day of surgery  ____ Stop Metformin and Janumet 2 days prior to surgery.  ____ Take 1/2 of usual insulin dose the night before surgery and none on the morning surgery.   ____ Follow recommendations from Cardiologist, Pulmonologist or PCP regarding stopping Aspirin, Coumadin, Plavix ,Eliquis, Effient, or Pradaxa, and Pletal.  X____Stop Anti-inflammatories such as Advil, Aleve, Ibuprofen, Motrin, Naproxen, Naprosyn, Goodies powders or aspirin products NOW-OK to take Tylenol   ____ Stop supplements until after surgery.     _X___ Bring C-Pap to the hospital.

## 2019-11-16 ENCOUNTER — Other Ambulatory Visit
Admission: RE | Admit: 2019-11-16 | Discharge: 2019-11-16 | Disposition: A | Discharge status: Home / Self Care | Payer: BLUE CROSS/BLUE SHIELD | Source: Ambulatory Visit | Attending: Surgery | Admitting: Surgery

## 2019-11-16 ENCOUNTER — Other Ambulatory Visit: Admission: RE | Admit: 2019-11-16 | Payer: BLUE CROSS/BLUE SHIELD | Source: Ambulatory Visit

## 2019-11-16 DIAGNOSIS — Z20828 Contact with and (suspected) exposure to other viral communicable diseases: Secondary | ICD-10-CM | POA: Insufficient documentation

## 2019-11-16 DIAGNOSIS — Z01812 Encounter for preprocedural laboratory examination: Secondary | ICD-10-CM | POA: Diagnosis present

## 2019-11-16 LAB — SARS CORONAVIRUS 2 (TAT 6-24 HRS): SARS Coronavirus 2: NEGATIVE

## 2019-11-21 ENCOUNTER — Ambulatory Visit
Admission: RE | Admit: 2019-11-21 | Discharge: 2019-11-21 | Disposition: A | Discharge status: Home / Self Care | Payer: BLUE CROSS/BLUE SHIELD | Attending: Surgery | Admitting: Surgery

## 2019-11-21 ENCOUNTER — Ambulatory Visit: Payer: BLUE CROSS/BLUE SHIELD | Admitting: Anesthesiology

## 2019-11-21 ENCOUNTER — Other Ambulatory Visit: Payer: Self-pay

## 2019-11-21 ENCOUNTER — Encounter
Admission: RE | Disposition: A | Discharge status: Home / Self Care | Payer: Self-pay | Source: Home / Self Care | Attending: Surgery

## 2019-11-21 ENCOUNTER — Encounter: Payer: Self-pay | Admitting: *Deleted

## 2019-11-21 DIAGNOSIS — M1990 Unspecified osteoarthritis, unspecified site: Secondary | ICD-10-CM | POA: Insufficient documentation

## 2019-11-21 DIAGNOSIS — Z79899 Other long term (current) drug therapy: Secondary | ICD-10-CM | POA: Diagnosis not present

## 2019-11-21 DIAGNOSIS — Z885 Allergy status to narcotic agent status: Secondary | ICD-10-CM | POA: Insufficient documentation

## 2019-11-21 DIAGNOSIS — Z96652 Presence of left artificial knee joint: Secondary | ICD-10-CM | POA: Diagnosis not present

## 2019-11-21 DIAGNOSIS — F329 Major depressive disorder, single episode, unspecified: Secondary | ICD-10-CM | POA: Diagnosis not present

## 2019-11-21 DIAGNOSIS — Z79891 Long term (current) use of opiate analgesic: Secondary | ICD-10-CM | POA: Diagnosis not present

## 2019-11-21 DIAGNOSIS — Z888 Allergy status to other drugs, medicaments and biological substances status: Secondary | ICD-10-CM | POA: Insufficient documentation

## 2019-11-21 DIAGNOSIS — G43909 Migraine, unspecified, not intractable, without status migrainosus: Secondary | ICD-10-CM | POA: Insufficient documentation

## 2019-11-21 DIAGNOSIS — G4700 Insomnia, unspecified: Secondary | ICD-10-CM | POA: Insufficient documentation

## 2019-11-21 DIAGNOSIS — L905 Scar conditions and fibrosis of skin: Secondary | ICD-10-CM | POA: Diagnosis present

## 2019-11-21 DIAGNOSIS — G894 Chronic pain syndrome: Secondary | ICD-10-CM | POA: Insufficient documentation

## 2019-11-21 DIAGNOSIS — I10 Essential (primary) hypertension: Secondary | ICD-10-CM | POA: Insufficient documentation

## 2019-11-21 DIAGNOSIS — G4730 Sleep apnea, unspecified: Secondary | ICD-10-CM | POA: Insufficient documentation

## 2019-11-21 DIAGNOSIS — F411 Generalized anxiety disorder: Secondary | ICD-10-CM | POA: Insufficient documentation

## 2019-11-21 DIAGNOSIS — I341 Nonrheumatic mitral (valve) prolapse: Secondary | ICD-10-CM | POA: Diagnosis not present

## 2019-11-21 DIAGNOSIS — Z791 Long term (current) use of non-steroidal anti-inflammatories (NSAID): Secondary | ICD-10-CM | POA: Insufficient documentation

## 2019-11-21 DIAGNOSIS — Z6831 Body mass index (BMI) 31.0-31.9, adult: Secondary | ICD-10-CM | POA: Diagnosis not present

## 2019-11-21 DIAGNOSIS — K589 Irritable bowel syndrome without diarrhea: Secondary | ICD-10-CM | POA: Insufficient documentation

## 2019-11-21 DIAGNOSIS — E669 Obesity, unspecified: Secondary | ICD-10-CM | POA: Diagnosis not present

## 2019-11-21 HISTORY — PX: KNEE ARTHROSCOPY: SHX127

## 2019-11-21 SURGERY — ARTHROSCOPY, KNEE
Anesthesia: General | Site: Knee | Laterality: Left

## 2019-11-21 MED ORDER — MIDAZOLAM HCL 5 MG/5ML IJ SOLN
INTRAMUSCULAR | Status: DC | PRN
  Administered 2019-11-21: 2 mg via INTRAVENOUS

## 2019-11-21 MED ORDER — HYDROCODONE-ACETAMINOPHEN 5-325 MG PO TABS
ORAL_TABLET | ORAL | Status: AC
  Filled 2019-11-21: qty 1

## 2019-11-21 MED ORDER — FAMOTIDINE 20 MG PO TABS
ORAL_TABLET | ORAL | Status: AC
  Administered 2019-11-21: 06:00:00 20 mg via ORAL
  Filled 2019-11-21: qty 1

## 2019-11-21 MED ORDER — LIDOCAINE HCL (PF) 1 % IJ SOLN
INTRAMUSCULAR | Status: AC
  Filled 2019-11-21: qty 30

## 2019-11-21 MED ORDER — HYDROCODONE-ACETAMINOPHEN 5-325 MG PO TABS
1.0000 | ORAL_TABLET | Freq: Once | ORAL | Status: AC
  Administered 2019-11-21: 10:00:00 1 via ORAL

## 2019-11-21 MED ORDER — CEFAZOLIN SODIUM-DEXTROSE 2-4 GM/100ML-% IV SOLN
INTRAVENOUS | Status: AC
  Filled 2019-11-21: qty 100

## 2019-11-21 MED ORDER — BUPIVACAINE HCL (PF) 0.5 % IJ SOLN
INTRAMUSCULAR | Status: AC
  Filled 2019-11-21: qty 30

## 2019-11-21 MED ORDER — BUPIVACAINE HCL 0.5 % IJ SOLN
INTRAMUSCULAR | Status: DC | PRN
  Administered 2019-11-21: 30 mL

## 2019-11-21 MED ORDER — ONDANSETRON HCL 4 MG/2ML IJ SOLN
INTRAMUSCULAR | Status: AC
  Filled 2019-11-21: qty 2

## 2019-11-21 MED ORDER — BUPIVACAINE-EPINEPHRINE (PF) 0.5% -1:200000 IJ SOLN
INTRAMUSCULAR | Status: DC | PRN
  Administered 2019-11-21: 50 mL via PERINEURAL

## 2019-11-21 MED ORDER — GLYCOPYRROLATE 0.2 MG/ML IJ SOLN
INTRAMUSCULAR | Status: DC | PRN
  Administered 2019-11-21: 0.2 mg via INTRAVENOUS

## 2019-11-21 MED ORDER — KETOROLAC TROMETHAMINE 30 MG/ML IJ SOLN
INTRAMUSCULAR | Status: DC | PRN
  Administered 2019-11-21: 30 mg via INTRAVENOUS

## 2019-11-21 MED ORDER — LIDOCAINE HCL (PF) 2 % IJ SOLN
INTRAMUSCULAR | Status: AC
  Filled 2019-11-21: qty 10

## 2019-11-21 MED ORDER — ONDANSETRON HCL 4 MG/2ML IJ SOLN
INTRAMUSCULAR | Status: DC | PRN
  Administered 2019-11-21: 4 mg via INTRAVENOUS

## 2019-11-21 MED ORDER — LACTATED RINGERS IV SOLN
INTRAVENOUS | Status: DC
  Administered 2019-11-21: 06:00:00 via INTRAVENOUS

## 2019-11-21 MED ORDER — CHLORHEXIDINE GLUCONATE 4 % EX LIQD: 60.0000 mL | Freq: Once | CUTANEOUS | Status: DC

## 2019-11-21 MED ORDER — FENTANYL CITRATE (PF) 100 MCG/2ML IJ SOLN: 25.0000 ug | INTRAMUSCULAR | Status: DC | PRN

## 2019-11-21 MED ORDER — PHENYLEPHRINE HCL (PRESSORS) 10 MG/ML IV SOLN
INTRAVENOUS | Status: DC | PRN
  Administered 2019-11-21 (×2): 100 ug via INTRAVENOUS
  Administered 2019-11-21: 150 ug via INTRAVENOUS
  Administered 2019-11-21: 100 ug via INTRAVENOUS
  Administered 2019-11-21: 50 ug via INTRAVENOUS

## 2019-11-21 MED ORDER — PROMETHAZINE HCL 25 MG/ML IJ SOLN: 6.2500 mg | INTRAMUSCULAR | Status: DC | PRN

## 2019-11-21 MED ORDER — PROPOFOL 10 MG/ML IV BOLUS
INTRAVENOUS | Status: DC | PRN
  Administered 2019-11-21: 20 mg via INTRAVENOUS
  Administered 2019-11-21: 180 mg via INTRAVENOUS

## 2019-11-21 MED ORDER — FENTANYL CITRATE (PF) 100 MCG/2ML IJ SOLN
INTRAMUSCULAR | Status: AC
  Filled 2019-11-21: qty 2

## 2019-11-21 MED ORDER — FENTANYL CITRATE (PF) 100 MCG/2ML IJ SOLN
INTRAMUSCULAR | Status: DC | PRN
  Administered 2019-11-21 (×4): 25 ug via INTRAVENOUS

## 2019-11-21 MED ORDER — LIDOCAINE HCL (CARDIAC) PF 100 MG/5ML IV SOSY
PREFILLED_SYRINGE | INTRAVENOUS | Status: DC | PRN
  Administered 2019-11-21: 100 mg via INTRAVENOUS

## 2019-11-21 MED ORDER — GLYCOPYRROLATE 0.2 MG/ML IJ SOLN
INTRAMUSCULAR | Status: AC
  Filled 2019-11-21: qty 1

## 2019-11-21 MED ORDER — MEPERIDINE HCL 50 MG/ML IJ SOLN: 6.2500 mg | INTRAMUSCULAR | Status: DC | PRN

## 2019-11-21 MED ORDER — CEFAZOLIN SODIUM-DEXTROSE 2-4 GM/100ML-% IV SOLN
2.0000 g | INTRAVENOUS | Status: AC
  Administered 2019-11-21: 08:00:00 2 g via INTRAVENOUS

## 2019-11-21 MED ORDER — DEXAMETHASONE SODIUM PHOSPHATE 10 MG/ML IJ SOLN
INTRAMUSCULAR | Status: AC
  Filled 2019-11-21: qty 1

## 2019-11-21 MED ORDER — KETOROLAC TROMETHAMINE 30 MG/ML IJ SOLN
INTRAMUSCULAR | Status: AC
  Filled 2019-11-21: qty 1

## 2019-11-21 MED ORDER — DEXAMETHASONE SODIUM PHOSPHATE 10 MG/ML IJ SOLN
INTRAMUSCULAR | Status: DC | PRN
  Administered 2019-11-21: 10 mg via INTRAVENOUS

## 2019-11-21 MED ORDER — FAMOTIDINE 20 MG PO TABS: 20.0000 mg | ORAL_TABLET | Freq: Once | ORAL | Status: AC

## 2019-11-21 MED ORDER — BUPIVACAINE-EPINEPHRINE (PF) 0.5% -1:200000 IJ SOLN
INTRAMUSCULAR | Status: AC
  Filled 2019-11-21: qty 60

## 2019-11-21 MED ORDER — PROPOFOL 10 MG/ML IV BOLUS
INTRAVENOUS | Status: AC
  Filled 2019-11-21: qty 20

## 2019-11-21 MED ORDER — MIDAZOLAM HCL 2 MG/2ML IJ SOLN
INTRAMUSCULAR | Status: AC
  Filled 2019-11-21: qty 2

## 2019-11-21 SURGICAL SUPPLY — 43 items
"PENCIL ELECTRO HAND CTR " (MISCELLANEOUS) ×1 IMPLANT
APL PRP STRL LF DISP 70% ISPRP (MISCELLANEOUS) ×1
BAG COUNTER SPONGE EZ (MISCELLANEOUS) IMPLANT
BAG SPNG 4X4 CLR HAZ (MISCELLANEOUS)
BLADE FULL RADIUS 3.5 (BLADE) ×3 IMPLANT
BLADE SHAVER 4.5X7 STR FR (MISCELLANEOUS) ×3 IMPLANT
BNDG ELASTIC 6X5.8 VLCR STR LF (GAUZE/BANDAGES/DRESSINGS) ×3 IMPLANT
CHLORAPREP W/TINT 26 (MISCELLANEOUS) ×3 IMPLANT
COUNTER SPONGE BAG EZ (MISCELLANEOUS)
COVER WAND RF STERILE (DRAPES) ×3 IMPLANT
CUFF TOURN SGL QUICK 24 (TOURNIQUET CUFF)
CUFF TOURN SGL QUICK 30 (TOURNIQUET CUFF) ×3
CUFF TRNQT CYL 24X4X16.5-23 (TOURNIQUET CUFF) IMPLANT
CUFF TRNQT CYL 30X4X21-28X (TOURNIQUET CUFF) IMPLANT
DRAPE IMP U-DRAPE 54X76 (DRAPES) ×2 IMPLANT
DRAPE SPLIT 6X30 W/TAPE (DRAPES) ×3 IMPLANT
ELECT REM PT RETURN 9FT ADLT (ELECTROSURGICAL) ×3
ELECTRODE REM PT RTRN 9FT ADLT (ELECTROSURGICAL) ×1 IMPLANT
GAUZE SPONGE 4X4 12PLY STRL (GAUZE/BANDAGES/DRESSINGS) ×3 IMPLANT
GAUZE XEROFORM 1X8 LF (GAUZE/BANDAGES/DRESSINGS) ×2 IMPLANT
GLOVE BIO SURGEON STRL SZ8 (GLOVE) ×6 IMPLANT
GLOVE BIOGEL M 7.0 STRL (GLOVE) ×6 IMPLANT
GLOVE BIOGEL PI IND STRL 7.5 (GLOVE) ×1 IMPLANT
GLOVE BIOGEL PI INDICATOR 7.5 (GLOVE) ×2
GLOVE INDICATOR 8.0 STRL GRN (GLOVE) ×3 IMPLANT
GOWN STRL REUS W/ TWL LRG LVL3 (GOWN DISPOSABLE) ×1 IMPLANT
GOWN STRL REUS W/ TWL XL LVL3 (GOWN DISPOSABLE) ×2 IMPLANT
GOWN STRL REUS W/TWL LRG LVL3 (GOWN DISPOSABLE) ×3
GOWN STRL REUS W/TWL XL LVL3 (GOWN DISPOSABLE) ×6
IV LACTATED RINGER IRRG 3000ML (IV SOLUTION) ×3
IV LACTATED RINGERS 1000ML (IV SOLUTION) ×2 IMPLANT
IV LR IRRIG 3000ML ARTHROMATIC (IV SOLUTION) ×1 IMPLANT
KIT TURNOVER KIT A (KITS) ×3 IMPLANT
MANIFOLD NEPTUNE II (INSTRUMENTS) ×3 IMPLANT
NDL HYPO 21X1.5 SAFETY (NEEDLE) ×1 IMPLANT
NEEDLE HYPO 21X1.5 SAFETY (NEEDLE) ×3 IMPLANT
PACK ARTHROSCOPY KNEE (MISCELLANEOUS) ×3 IMPLANT
PENCIL ELECTRO HAND CTR (MISCELLANEOUS) ×3 IMPLANT
SUT PROLENE 4 0 PS 2 18 (SUTURE) ×3 IMPLANT
SUT TICRON COATED BLUE 2 0 30 (SUTURE) IMPLANT
SYR 50ML LL SCALE MARK (SYRINGE) ×3 IMPLANT
TUBING ARTHRO INFLOW-ONLY STRL (TUBING) ×3 IMPLANT
WAND WEREWOLF FLOW 90D (MISCELLANEOUS) ×3 IMPLANT

## 2019-11-21 NOTE — Anesthesia Procedure Notes (Signed)
Procedure Name: LMA Insertion Date/Time: 11/21/2019 7:35 AM Performed by: Dionne Bucy, CRNA Pre-anesthesia Checklist: Patient identified, Patient being monitored, Timeout performed, Emergency Drugs available and Suction available Patient Re-evaluated:Patient Re-evaluated prior to induction Oxygen Delivery Method: Circle system utilized Preoxygenation: Pre-oxygenation with 100% oxygen Induction Type: IV induction Ventilation: Mask ventilation without difficulty LMA: LMA inserted Tube type: Oral Number of attempts: 1 Placement Confirmation: positive ETCO2 and breath sounds checked- equal and bilateral Tube secured with: Tape Dental Injury: Teeth and Oropharynx as per pre-operative assessment

## 2019-11-21 NOTE — Anesthesia Postprocedure Evaluation (Signed)
Anesthesia Post Note  Patient: Carolyn Allen  Procedure(s) Performed: ARTHROSCOPY KNEE WITH DEBRIDEMENT OF PAINFUL SCAR TISSUE STATUS POST A PRIOR TOTAL KNEE ARTHROPLASTY (Left Knee)  Patient location during evaluation: PACU Anesthesia Type: General Level of consciousness: awake and alert and oriented Pain management: pain level controlled Vital Signs Assessment: post-procedure vital signs reviewed and stable Respiratory status: spontaneous breathing, nonlabored ventilation and respiratory function stable Cardiovascular status: blood pressure returned to baseline and stable Postop Assessment: no signs of nausea or vomiting Anesthetic complications: no     Last Vitals:  Vitals:   11/21/19 0918 11/21/19 0951  BP: (!) 144/89 134/80  Pulse: 78 80  Resp: 16 16  Temp: (!) 36.3 C   SpO2: 98% 96%    Last Pain:  Vitals:   11/21/19 0951  TempSrc:   PainSc: 5                  Syrita Dovel

## 2019-11-21 NOTE — Anesthesia Preprocedure Evaluation (Signed)
Anesthesia Evaluation  Patient identified by MRN, date of birth, ID band Patient awake    Reviewed: Allergy & Precautions, NPO status , Patient's Chart, lab work & pertinent test results  History of Anesthesia Complications (+) PONV and history of anesthetic complications  Airway Mallampati: II  TM Distance: >3 FB Neck ROM: Full    Dental  (+) Edentulous Upper, Poor Dentition, Missing   Pulmonary sleep apnea , neg COPD,    breath sounds clear to auscultation- rhonchi (-) wheezing      Cardiovascular hypertension, (-) CAD, (-) Past MI, (-) Cardiac Stents and (-) CABG  Rhythm:Regular Rate:Normal - Systolic murmurs and - Diastolic murmurs    Neuro/Psych  Headaches, neg Seizures PSYCHIATRIC DISORDERS Anxiety Depression    GI/Hepatic Neg liver ROS, GERD  ,  Endo/Other  negative endocrine ROSneg diabetes  Renal/GU negative Renal ROS     Musculoskeletal  (+) Arthritis ,   Abdominal (+) + obese,   Peds  Hematology negative hematology ROS (+)   Anesthesia Other Findings Past Medical History: No date: Allergy No date: Arthritis No date: Back pain No date: Depression No date: GERD (gastroesophageal reflux disease)     Comment:  no meds No date: Heart murmur No date: HSV (herpes simplex virus) infection No date: Hypertension No date: Migraines     Comment:  migraines No date: Mitral valve prolapse     Comment:  does not see cardiologist No date: PONV (postoperative nausea and vomiting) No date: Sleep apnea     Comment:  has not used cpap machine lately No date: TMJ (temporomandibular joint syndrome)   Reproductive/Obstetrics                             Anesthesia Physical Anesthesia Plan  ASA: II  Anesthesia Plan: General   Post-op Pain Management:    Induction: Intravenous  PONV Risk Score and Plan: 3 and Ondansetron, Dexamethasone and Midazolam  Airway Management Planned:  LMA  Additional Equipment:   Intra-op Plan:   Post-operative Plan:   Informed Consent: I have reviewed the patients History and Physical, chart, labs and discussed the procedure including the risks, benefits and alternatives for the proposed anesthesia with the patient or authorized representative who has indicated his/her understanding and acceptance.     Dental advisory given  Plan Discussed with: CRNA and Anesthesiologist  Anesthesia Plan Comments:         Anesthesia Quick Evaluation

## 2019-11-21 NOTE — Anesthesia Post-op Follow-up Note (Signed)
Anesthesia QCDR form completed.        

## 2019-11-21 NOTE — H&P (Signed)
Paper H&P to be scanned into permanent record. H&P reviewed and patient re-examined. No changes. 
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
## 2019-11-21 NOTE — Discharge Instructions (Addendum)
Orthopedic discharge instructions: Keep dressing dry and intact.  May shower after dressing changed on post-op day #4 (Sunday).  Cover sutures with Band-Aids after drying off. Apply ice frequently to knee. Take Mobic daily with meals for 7-10 days, then as necessary. Take pain medication as prescribed or ES Tylenol when needed.  May weight-bear as tolerated - use cane, crutches, or walker as needed. Follow-up in 10-14 days or as scheduled.   AMBULATORY SURGERY  DISCHARGE INSTRUCTIONS   1) The drugs that you were given will stay in your system until tomorrow so for the next 24 hours you should not:  A) Drive an automobile B) Make any legal decisions C) Drink any alcoholic beverage   2) You may resume regular meals tomorrow.  Today it is better to start with liquids and gradually work up to solid foods.  You may eat anything you prefer, but it is better to start with liquids, then soup and crackers, and gradually work up to solid foods.   3) Please notify your doctor immediately if you have any unusual bleeding, trouble breathing, redness and pain at the surgery site, drainage, fever, or pain not relieved by medication.    4) Additional Instructions:        Please contact your physician with any problems or Same Day Surgery at [phone], Monday through Friday 6 am to 4 pm, or [facility] at [facility] number at [phone].

## 2019-11-21 NOTE — Transfer of Care (Signed)
Immediate Anesthesia Transfer of Care Note  Patient: Carolyn Allen  Procedure(s) Performed: ARTHROSCOPY KNEE WITH DEBRIDEMENT OF PAINFUL SCAR TISSUE STATUS POST A PRIOR TOTAL KNEE ARTHROPLASTY (Left Knee)  Patient Location: PACU  Anesthesia Type:General  Level of Consciousness: sedated  Airway & Oxygen Therapy: Patient Spontanous Breathing and Patient connected to face mask oxygen  Post-op Assessment: Report given to RN and Post -op Vital signs reviewed and stable  Post vital signs: Reviewed and stable  Last Vitals:  Vitals Value Taken Time  BP 141/76 11/21/19 0844  Temp 35.8 C 11/21/19 0845  Pulse 103 11/21/19 0845  Resp 17 11/21/19 0845  SpO2 100 % 11/21/19 0845  Vitals shown include unvalidated device data.  Last Pain:  Vitals:   11/21/19 0625  PainSc: 7          Complications: No apparent anesthesia complications

## 2020-01-07 ENCOUNTER — Other Ambulatory Visit: Payer: Self-pay | Admitting: Orthopedic Surgery

## 2020-01-07 DIAGNOSIS — M542 Cervicalgia: Secondary | ICD-10-CM

## 2020-01-10 ENCOUNTER — Telehealth: Payer: Self-pay | Admitting: Nurse Practitioner

## 2020-01-10 NOTE — Telephone Encounter (Signed)
Phone call to patient to verify medication list and allergies for myelogram procedure. Pt instructed to hold cymbalta, phenergan, paxil, imitrex and tramadol for 48hrs prior to myelogram appointment time. Pt verbalized understanding. Pre and post procedure instructions reviewed with pt.

## 2020-01-18 ENCOUNTER — Ambulatory Visit
Admission: RE | Admit: 2020-01-18 | Discharge: 2020-01-18 | Disposition: A | Discharge status: Home / Self Care | Payer: BLUE CROSS/BLUE SHIELD | Source: Ambulatory Visit | Attending: Orthopedic Surgery | Admitting: Orthopedic Surgery

## 2020-01-18 ENCOUNTER — Ambulatory Visit
Admission: RE | Admit: 2020-01-18 | Discharge: 2020-01-18 | Disposition: A | Discharge status: Home / Self Care | Payer: 59 | Source: Ambulatory Visit | Attending: Orthopedic Surgery | Admitting: Orthopedic Surgery

## 2020-01-18 ENCOUNTER — Other Ambulatory Visit: Payer: Self-pay

## 2020-01-18 DIAGNOSIS — M542 Cervicalgia: Secondary | ICD-10-CM

## 2020-01-18 MED ORDER — DIAZEPAM 5 MG PO TABS
10.0000 mg | ORAL_TABLET | Freq: Once | ORAL | Status: AC
  Administered 2020-01-18: 10:00:00 10 mg via ORAL

## 2020-01-18 MED ORDER — IOPAMIDOL (ISOVUE-M 300) INJECTION 61%: 10.0000 mL | Freq: Once | INTRAMUSCULAR | Status: DC | PRN

## 2020-01-18 MED ORDER — IOPAMIDOL (ISOVUE-M 200) INJECTION 41%: 15.0000 mL | Freq: Once | INTRAMUSCULAR | Status: DC

## 2020-01-18 NOTE — Discharge Instructions (Signed)
Myelogram Discharge Instructions  1. Go home and rest quietly for the next 24 hours.  It is important to lie flat for the next 24 hours.  Get up only to go to the restroom.  You may lie in the bed or on a couch on your back, your stomach, your left side or your right side.  You may have one pillow under your head.  You may have pillows between your knees while you are on your side or under your knees while you are on your back.  2. DO NOT drive today.  Recline the seat as far back as it will go, while still wearing your seat belt, on the way home.  3. You may get up to go to the bathroom as needed.  You may sit up for 10 minutes to eat.  You may resume your normal diet and medications unless otherwise indicated.  Drink lots of extra fluids today and tomorrow.  4. The incidence of headache, nausea, or vomiting is about 5% (one in 20 patients).  If you develop a headache, lie flat and drink plenty of fluids until the headache goes away.  Caffeinated beverages may be helpful.  If you develop severe nausea and vomiting or a headache that does not go away with flat bed rest, call [phone].  5. You may resume normal activities after your 24 hours of bed rest is over; however, do not exert yourself strongly or do any heavy lifting tomorrow. If when you get up you have a headache when standing, go back to bed and force fluids for another 24 hours.  6. Call your physician for a follow-up appointment.  The results of your myelogram will be sent directly to your physician by the following day.  7. If you have any questions or if complications develop after you arrive home, please call [phone].  Discharge instructions have been explained to the patient.  The patient, or the person responsible for the patient, fully understands these instructions.  YOU MAY RESTART YOUR CYMBALTA, PHENERGAN, PAXIL, IMITREX AND TRAMADOL TOMORROW 01/19/20 AT 10:30AM.

## 2020-01-18 NOTE — Progress Notes (Signed)
Pt reports she has been off her cymbalta, phenergan, paxil, tramadol and imitrex for at least 48 hrs for her myelogram procedure today.

## 2020-01-23 ENCOUNTER — Other Ambulatory Visit: Payer: Self-pay | Admitting: Orthopedic Surgery

## 2020-01-23 DIAGNOSIS — M5030 Other cervical disc degeneration, unspecified cervical region: Secondary | ICD-10-CM

## 2020-02-01 ENCOUNTER — Other Ambulatory Visit: Payer: Self-pay

## 2020-02-01 ENCOUNTER — Ambulatory Visit
Admission: RE | Admit: 2020-02-01 | Discharge: 2020-02-01 | Disposition: A | Discharge status: Home / Self Care | Payer: 59 | Source: Ambulatory Visit | Attending: Orthopedic Surgery | Admitting: Orthopedic Surgery

## 2020-02-01 DIAGNOSIS — M5030 Other cervical disc degeneration, unspecified cervical region: Secondary | ICD-10-CM

## 2020-02-01 MED ORDER — IOPAMIDOL (ISOVUE-M 300) INJECTION 61%
1.0000 mL | Freq: Once | INTRAMUSCULAR | Status: AC | PRN
  Administered 2020-02-01: 1 mL via EPIDURAL

## 2020-02-01 MED ORDER — TRIAMCINOLONE ACETONIDE 40 MG/ML IJ SUSP (RADIOLOGY)
60.0000 mg | Freq: Once | INTRAMUSCULAR | Status: AC
  Administered 2020-02-01: 15:00:00 60 mg via EPIDURAL

## 2020-02-01 NOTE — Discharge Instructions (Signed)

## 2020-07-21 ENCOUNTER — Other Ambulatory Visit: Payer: Self-pay

## 2020-07-21 ENCOUNTER — Ambulatory Visit
Admission: EM | Admit: 2020-07-21 | Discharge: 2020-07-21 | Disposition: A | Discharge status: Home / Self Care | Payer: 59 | Attending: Emergency Medicine | Admitting: Emergency Medicine

## 2020-07-21 DIAGNOSIS — S91109A Unspecified open wound of unspecified toe(s) without damage to nail, initial encounter: Secondary | ICD-10-CM

## 2020-07-21 NOTE — ED Triage Notes (Signed)
Pt c/o blister to lt 5th digit x2 days. States has been on her feet a lot working.

## 2020-07-21 NOTE — ED Provider Notes (Signed)
[facility]    CSN: [id number] Arrival date & time: 07/21/20  0814      History   Chief Complaint Chief Complaint  Patient presents with  . Toe Pain    HPI Carolyn Allen is a 54 y.o. female presenting for left fifth digit blister x2 days.  Patient denies preceding event, bug bite, history of diabetes.  No numbness, deformity, purulent discharge or malodor.  States the blister popped on its own.  Patient does have pain: Has to wear tennis shoes and is on her feet a lot and sweats at work.  Has been keeping it covered with a Band-Aid.    Past Medical History:  Diagnosis Date  . Allergy   . Arthritis   . Back pain   . Depression   . GERD (gastroesophageal reflux disease)    no meds  . Heart murmur   . HSV (herpes simplex virus) infection   . Hypertension   . Migraines    migraines  . Mitral valve prolapse    does not see cardiologist  . PONV (postoperative nausea and vomiting)   . Sleep apnea    has not used cpap machine lately  . TMJ (temporomandibular joint syndrome)     Patient Active Problem List   Diagnosis Date Noted  . Sleep related hypoventilation/hypoxemia in other disease 06/30/2016  . OSA on CPAP 06/30/2016  . Snoring 01/27/2016  . Hypersomnia with sleep apnea 01/27/2016  . Morbid obesity due to excess calories (HCC) 01/27/2016  . Insomnia with sleep apnea 01/27/2016  . Chronic pain syndrome 01/27/2016  . Mitral valve prolapse 12/18/2014  . Pelvic floor dysfunction 02/21/2014  . Incontinence of feces 01/24/2014  . Rectal fullness 01/24/2014  . Unspecified constipation 01/08/2014  . HTN (hypertension) 04/19/2012  . Migraine headache 04/19/2012  . IBS (irritable bowel syndrome) 04/19/2012  . GAD (generalized anxiety disorder) 04/19/2012  . Depression 04/19/2012  . AR (allergic rhinitis) 04/19/2012  . Chronic back pain 04/19/2012  . Chronic neck pain 04/19/2012    Past Surgical History:  Procedure Laterality Date  . ABDOMINAL  HYSTERECTOMY    . CYSTECTOMY     ovaries  . JOINT REPLACEMENT     both knees  . KNEE ARTHROSCOPY Left 11/21/2019   Procedure: ARTHROSCOPY KNEE WITH DEBRIDEMENT OF PAINFUL SCAR TISSUE STATUS POST A PRIOR TOTAL KNEE ARTHROPLASTY;  Surgeon: Christena Flake, MD;  Location: [facility] ORS;  Service: Orthopedics;  Laterality: Left;  . SHOULDER SURGERY Right   . SPINAL CORD STIMULATOR INSERTION N/A 10/08/2013   Procedure: LUMBAR SPINAL CORD STIMULATOR INSERTION;  Surgeon: Venita Lick, MD;  Location: [facility] OR;  Service: Orthopedics;  Laterality: N/A;  . SPINE SURGERY    . TMJ ARTHROPLASTY      OB History    Gravida  1   Para  1   Term      Preterm  1   AB      Living  1     SAB      TAB      Ectopic      Multiple      Live Births               Home Medications    Prior to Admission medications   Medication Sig Start Date End Date Taking? Authorizing Provider  ALPRAZolam (XANAX) 0.25 MG tablet Take 1 tablet (0.25 mg total) by mouth 3 (three) times daily as needed. for anxiety Patient taking differently: Take 0.25  mg by mouth at bedtime. And prn 03/05/16   Tonye Pearson, MD  amLODipine (NORVASC) 2.5 MG tablet Take 2.5 mg by mouth every morning.     [provider]  Ascorbic Acid (VITAMIN C PO) Take 1 tablet by mouth daily.    [provider]  Calcium Carbonate-Vitamin D (CALCIUM + D PO) Take 1 tablet by mouth daily.    [provider]  cyclobenzaprine (FLEXERIL) 10 MG tablet Take 1 tablet (10 mg total) by mouth 3 (three) times daily as needed. Patient taking differently: Take 20 mg by mouth 2 (two) times daily.  08/01/18   Joni Reining, PA-C  dicyclomine (BENTYL) 20 MG tablet Take 1 tablet (20 mg total) by mouth every 6 (six) hours. Patient taking differently: Take 20 mg by mouth 3 (three) times daily before meals.  03/05/16   Tonye Pearson, MD  DULoxetine (CYMBALTA) 20 MG capsule Take 20 mg by mouth every morning.     [provider]  lisinopril (ZESTRIL) 40 MG tablet Take 40 mg by mouth every morning.     [provider]  meloxicam (MOBIC) 7.5 MG tablet Take 1 tablet (7.5 mg total) by mouth 2 (two) times daily. 03/05/16   Tonye Pearson, MD  Multiple Vitamins-Minerals (MULTIVITAMIN WITH MINERALS) tablet Take 1 tablet by mouth daily.    [provider]  PARoxetine (PAXIL) 10 MG tablet Take 10 mg by mouth every morning.     [provider]  polyethylene glycol (MIRALAX / GLYCOLAX) 17 g packet Take 17 g by mouth daily as needed.    [provider]  potassium chloride (KLOR-CON) 8 MEQ tablet Take 16 mEq by mouth daily. 10/09/19   [provider]  promethazine (PHENERGAN) 12.5 MG tablet Take 1 tablet (12.5 mg total) by mouth 4 (four) times daily as needed. 03/05/16   Tonye Pearson, MD  SUMAtriptan (IMITREX) 50 MG tablet Take 50 mg by mouth every 2 (two) hours as needed for migraine. May repeat in 2 hours if headache persists or recurs.    [provider]  traMADol (ULTRAM) 50 MG tablet TAKE 1 TABLET BY MOUTH EVERY 6 HOURS AS NEEDED. 06/19/16   Tonye Pearson, MD    Family History Family History  Problem Relation Age of Onset  . Cancer Mother        colon and lung  . Cancer Father        lung  . Heart disease Father   . Heart disease Sister   . Hypertension Sister   . Diabetes Sister   . Mental illness Sister   . Fibromyalgia Sister     Social History Social History   Tobacco Use  . Smoking status: Never Smoker  . Smokeless tobacco: Never Used  Vaping Use  . Vaping Use: Never used  Substance Use Topics  . Alcohol use: No  . Drug use: No     Allergies   Codeine, Methocarbamol, Oxycodone, and Tizanidine   Review of Systems As per HPI   Physical Exam Triage Vital Signs ED Triage Vitals  Enc Vitals Group     BP      Pulse      Resp      Temp      Temp src      SpO2      Weight      Height      Head Circumference      Peak  Flow  Pain Score      Pain Loc      Pain Edu?      Excl. in GC?    No data found.  Updated Vital Signs BP (!) 155/103 (BP Location: Left Arm)   Pulse 95   Temp 98.6 F (37 C) (Oral)   Resp 18   SpO2 96%   Visual Acuity Right Eye Distance:   Left Eye Distance:   Bilateral Distance:    Right Eye Near:   Left Eye Near:    Bilateral Near:     Physical Exam Constitutional:      General: She is not in acute distress. HENT:     Head: Normocephalic and atraumatic.  Eyes:     General: No scleral icterus.    Pupils: Pupils are equal, round, and reactive to light.  Cardiovascular:     Rate and Rhythm: Normal rate.  Pulmonary:     Effort: Pulmonary effort is normal.  Skin:    Coloration: Skin is not jaundiced or pale.     Comments: Open wound to medial aspect of left fifth digit.  NVI.  Surrounding tissue white with edema.  No purulence, malodor, necrosis.  Neurological:     Mental Status: She is alert and oriented to person, place, and time.      UC Treatments / Results  Labs (all labs ordered are listed, but only abnormal results are displayed) Labs Reviewed - No data to display  EKG   Radiology No results found.  Procedures Procedures (including critical care time)  Medications Ordered in UC Medications - No data to display  Initial Impression / Assessment and Plan / UC Course  I have reviewed the triage vital signs and the nursing notes.  Pertinent labs & imaging results that were available during my care of the patient were reviewed by me and considered in my medical decision making (see chart for details).     Patient febrile, nontoxic in office today.  No sign of secondary cellulitis or necrosis.  Discussed foot hygiene to help reduce moisture as outlined below and provided postop boot to wear at work.  Provided contact information for podiatry follow-up.  Return precautions discussed, pt verbalized understanding and is agreeable to plan. Final  Clinical Impressions(s) / UC Diagnoses   Final diagnoses:  Open wound of toe, initial encounter     Discharge Instructions     Keep foot clean and dry. No sign of infection at this time. Recommend putting piece of gauze in between toes, wearing postop shoe, changing socks every 3-4 hours. Very important follow-up with podiatry for further evaluation and management.    ED Prescriptions    None     I have reviewed the PDMP during this encounter.   Hall[place]Potvin, [place], [place] 07/21/20 0901

## 2020-07-21 NOTE — Discharge Instructions (Signed)
Keep foot clean and dry. No sign of infection at this time. Recommend putting piece of gauze in between toes, wearing postop shoe, changing socks every 3-4 hours. Very important follow-up with podiatry for further evaluation and management.

## 2022-03-17 ENCOUNTER — Ambulatory Visit
Admission: EM | Admit: 2022-03-17 | Discharge: 2022-03-17 | Disposition: A | Discharge status: Home / Self Care | Payer: Managed Care, Other (non HMO)

## 2022-03-17 DIAGNOSIS — J309 Allergic rhinitis, unspecified: Secondary | ICD-10-CM

## 2022-03-17 DIAGNOSIS — J019 Acute sinusitis, unspecified: Secondary | ICD-10-CM

## 2022-03-17 MED ORDER — AMOXICILLIN 500 MG PO CAPS: 500.0000 mg | ORAL_CAPSULE | Freq: Three times a day (TID) | ORAL | 0 refills | Status: DC

## 2022-03-17 MED ORDER — PREDNISONE 20 MG PO TABS: ORAL_TABLET | ORAL | 0 refills | Status: DC

## 2022-03-17 MED ORDER — LEVOCETIRIZINE DIHYDROCHLORIDE 5 MG PO TABS: 5.0000 mg | ORAL_TABLET | Freq: Every evening | ORAL | 0 refills | Status: DC

## 2022-03-17 NOTE — ED Triage Notes (Signed)
Pt reports sinus pressure, runny nose, cough, throat feel raw,  headache; vomiting x 1 day.  Pt started doxycycline 100 mg and tessalon on 03/15/2022 prescribed by her PCP.  ?

## 2022-03-17 NOTE — ED Provider Notes (Signed)
?[place] ? ? ?MRN: [id number] DOB: [birth year] ? ?Subjective:  ? ?Carolyn Allen is a 56 y.o. female presenting for 1 week history of persistent sinus pressure, runny nose, throat pain, coughing, sinus headache, right-sided facial pain that radiates to the the right ear.  She is also had bilateral ear popping.  Her PCP did prescribe doxycycline which she has had a very difficult time tolerating this.  Was advised to come in and be evaluated at our clinic.  No chest pain, shortness of breath or wheezing.  She does have a history of allergic rhinitis but is not taking anything consistently for this.  She is not a smoker. ? ?No current facility-administered medications for this encounter. ? ?Current Outpatient Medications:  ?  amLODipine (NORVASC) 2.5 MG tablet, amlodipine 2.5 mg tablet, Disp: , Rfl:  ?  benzonatate (TESSALON) 100 MG capsule, Take by mouth., Disp: , Rfl:  ?  doxycycline (DORYX) 100 MG EC tablet, Take 100 mg by mouth 2 (two) times daily., Disp: , Rfl:  ?  HYDROcodone-acetaminophen (NORCO/VICODIN) 5-325 MG tablet, Take by mouth., Disp: , Rfl:  ?  lisinopril-hydrochlorothiazide (ZESTORETIC) 20-12.5 MG tablet, Take 2 tablets by mouth daily., Disp: , Rfl:  ?  ALPRAZolam (XANAX) 0.25 MG tablet, Take 1 tablet (0.25 mg total) by mouth 3 (three) times daily as needed. for anxiety (Patient taking differently: Take 0.25 mg by mouth at bedtime. And prn), Disp: 90 tablet, Rfl: 2 ?  amLODipine (NORVASC) 2.5 MG tablet, Take 2.5 mg by mouth every morning. , Disp: , Rfl:  ?  Ascorbic Acid (VITAMIN C PO), Take 1 tablet by mouth daily., Disp: , Rfl:  ?  ascorbic Acid (VITAMIN C) 500 MG CPCR, Take by mouth., Disp: , Rfl:  ?  Calcium Carbonate-Vitamin D (CALCIUM + D PO), Take 1 tablet by mouth daily., Disp: , Rfl:  ?  cyclobenzaprine (FLEXERIL) 10 MG tablet, Take 1 tablet (10 mg total) by mouth 3 (three) times daily as needed. (Patient taking differently: Take 20 mg by mouth 2 (two) times daily.),  Disp: 15 tablet, Rfl: 0 ?  dicyclomine (BENTYL) 20 MG tablet, Take 1 tablet (20 mg total) by mouth every 6 (six) hours. (Patient taking differently: Take 20 mg by mouth 3 (three) times daily before meals.), Disp: 120 tablet, Rfl: 5 ?  DULoxetine (CYMBALTA) 20 MG capsule, Take 20 mg by mouth every morning. , Disp: , Rfl:  ?  lisinopril (ZESTRIL) 40 MG tablet, Take 40 mg by mouth every morning. , Disp: , Rfl:  ?  meloxicam (MOBIC) 7.5 MG tablet, Take 1 tablet (7.5 mg total) by mouth 2 (two) times daily., Disp: 60 tablet, Rfl: 11 ?  Multiple Vitamins-Minerals (MULTIVITAMIN WITH MINERALS) tablet, Take 1 tablet by mouth daily., Disp: , Rfl:  ?  PARoxetine (PAXIL) 10 MG tablet, Take 10 mg by mouth every morning. , Disp: , Rfl:  ?  polyethylene glycol (MIRALAX / GLYCOLAX) 17 g packet, Take 17 g by mouth daily as needed., Disp: , Rfl:  ?  potassium chloride (KLOR-CON) 8 MEQ tablet, Take 16 mEq by mouth daily., Disp: , Rfl:  ?  promethazine (PHENERGAN) 12.5 MG tablet, Take 1 tablet (12.5 mg total) by mouth 4 (four) times daily as needed., Disp: 30 tablet, Rfl: 5 ?  SUMAtriptan (IMITREX) 50 MG tablet, Take 50 mg by mouth every 2 (two) hours as needed for migraine. May repeat in 2 hours if headache persists or recurs., Disp: , Rfl:  ?  traMADol (ULTRAM)  50 MG tablet, TAKE 1 TABLET BY MOUTH EVERY 6 HOURS AS NEEDED., Disp: 120 tablet, Rfl: 0  ? ?Allergies  ?Allergen Reactions  ? Codeine Other (See Comments)  ?  Causes numbness on one side of body  ? Methocarbamol Nausea And Vomiting  ? Oxycodone Diarrhea and Nausea And Vomiting  ? Tizanidine Diarrhea  ? ? ?Past Medical History:  ?Diagnosis Date  ? Allergy   ? Arthritis   ? Back pain   ? Depression   ? GERD (gastroesophageal reflux disease)   ? no meds  ? Heart murmur   ? HSV (herpes simplex virus) infection   ? Hypertension   ? Migraines   ? migraines  ? Mitral valve prolapse   ? does not see cardiologist  ? PONV (postoperative nausea and vomiting)   ? Sleep apnea   ? has not  used cpap machine lately  ? TMJ (temporomandibular joint syndrome)   ?  ? ?Past Surgical History:  ?Procedure Laterality Date  ? ABDOMINAL HYSTERECTOMY    ? CYSTECTOMY    ? ovaries  ? JOINT REPLACEMENT    ? both knees  ? KNEE ARTHROSCOPY Left 11/21/2019  ? Procedure: ARTHROSCOPY KNEE WITH DEBRIDEMENT OF PAINFUL SCAR TISSUE STATUS POST A PRIOR TOTAL KNEE ARTHROPLASTY;  Surgeon: Corky Mull, MD;  Location: [facility];  Service: Orthopedics;  Laterality: Left;  ? SHOULDER SURGERY Right   ? SPINAL CORD STIMULATOR INSERTION N/A 10/08/2013  ? Procedure: LUMBAR SPINAL CORD STIMULATOR INSERTION;  Surgeon: Melina Schools, MD;  Location: [facility];  Service: Orthopedics;  Laterality: N/A;  ? SPINE SURGERY    ? TMJ ARTHROPLASTY    ? ? ?Family History  ?Problem Relation Age of Onset  ? Cancer Mother   ?     colon and lung  ? Cancer Father   ?     lung  ? Heart disease Father   ? Heart disease Sister   ? Hypertension Sister   ? Diabetes Sister   ? Mental illness Sister   ? Fibromyalgia Sister   ? ? ?Social History  ? ?Tobacco Use  ? Smoking status: Never  ? Smokeless tobacco: Never  ?Vaping Use  ? Vaping Use: Never used  ?Substance Use Topics  ? Alcohol use: No  ? Drug use: No  ? ? ?ROS ? ? ?Objective:  ? ?Vitals: ?BP 132/88 (BP Location: Right Arm)   Pulse 100   Temp 98.6 ?F (37 ?C) (Oral)   Resp 18   SpO2 98%  ? ?Physical Exam ?Constitutional:   ?   General: She is not in acute distress. ?   Appearance: Normal appearance. She is well-developed and normal weight. She is not ill-appearing, toxic-appearing or diaphoretic.  ?HENT:  ?   Head: Normocephalic and atraumatic.  ?   Right Ear: Tympanic membrane, ear canal and external ear normal. No drainage or tenderness. No middle ear effusion. There is no impacted cerumen. Tympanic membrane is not erythematous.  ?   Left Ear: Tympanic membrane, ear canal and external ear normal. No drainage or tenderness.  No middle ear effusion. There is no impacted cerumen. Tympanic membrane is not  erythematous.  ?   Nose: Congestion present. No rhinorrhea.  ?   Mouth/Throat:  ?   Mouth: Mucous membranes are moist. No oral lesions.  ?   Pharynx: No pharyngeal swelling, oropharyngeal exudate, posterior oropharyngeal erythema or uvula swelling.  ?   Tonsils: No tonsillar exudate or tonsillar abscesses.  ?Eyes:  ?  General: No scleral icterus.    ?   Right eye: No discharge.     ?   Left eye: No discharge.  ?   Extraocular Movements: Extraocular movements intact.  ?   Right eye: Normal extraocular motion.  ?   Left eye: Normal extraocular motion.  ?   Conjunctiva/sclera: Conjunctivae normal.  ?Cardiovascular:  ?   Rate and Rhythm: Normal rate.  ?   Heart sounds: No murmur heard. ?  No friction rub. No gallop.  ?Pulmonary:  ?   Effort: Pulmonary effort is normal. No respiratory distress.  ?   Breath sounds: No stridor. No wheezing, rhonchi or rales.  ?Chest:  ?   Chest wall: No tenderness.  ?Musculoskeletal:  ?   Cervical back: Normal range of motion and neck supple.  ?Lymphadenopathy:  ?   Cervical: No cervical adenopathy.  ?Skin: ?   General: Skin is warm and dry.  ?Neurological:  ?   General: No focal deficit present.  ?   Mental Status: She is alert and oriented to person, place, and time.  ?Psychiatric:     ?   Mood and Affect: Mood normal.     ?   Behavior: Behavior normal.  ? ? ?Assessment and Plan :  ? ?PDMP not reviewed this encounter. ? ?1. Allergic rhinitis, unspecified seasonality, unspecified trigger   ?2. Acute non-recurrent sinusitis, unspecified location   ? ?Deferred imaging given clear cardiopulmonary exam, hemodynamically stable vital signs. Will switch her from doxycycline to amoxicillin 500 mg 3 times daily for 7 days.  I do suspect that the primary problem is recurrent allergic rhinitis and therefore recommended an oral prednisone course.  Recommended supportive care otherwise including the use of oral antihistamine.  Follow-up with PCP.  Counseled patient on potential for adverse effects  with medications prescribed/recommended today, ER and return-to-clinic precautions discussed, patient verbalized understanding. ? ?  ?Jaynee Eagles, PA-C ?03/17/22 [id number] ? ?

## 2022-05-17 ENCOUNTER — Encounter (HOSPITAL_COMMUNITY): Payer: Self-pay | Admitting: *Deleted

## 2022-05-17 ENCOUNTER — Emergency Department (HOSPITAL_COMMUNITY)
Admission: EM | Admit: 2022-05-17 | Discharge: 2022-05-18 | Disposition: A | Discharge status: Home / Self Care | Payer: Commercial Managed Care - HMO | Attending: Emergency Medicine | Admitting: Emergency Medicine

## 2022-05-17 ENCOUNTER — Other Ambulatory Visit: Payer: Self-pay

## 2022-05-17 DIAGNOSIS — S81001A Unspecified open wound, right knee, initial encounter: Secondary | ICD-10-CM | POA: Diagnosis not present

## 2022-05-17 DIAGNOSIS — Z23 Encounter for immunization: Secondary | ICD-10-CM | POA: Insufficient documentation

## 2022-05-17 DIAGNOSIS — W540XXA Bitten by dog, initial encounter: Secondary | ICD-10-CM | POA: Diagnosis not present

## 2022-05-17 DIAGNOSIS — S41102A Unspecified open wound of left upper arm, initial encounter: Secondary | ICD-10-CM | POA: Diagnosis present

## 2022-05-17 DIAGNOSIS — Z79899 Other long term (current) drug therapy: Secondary | ICD-10-CM | POA: Insufficient documentation

## 2022-05-17 DIAGNOSIS — S71102A Unspecified open wound, left thigh, initial encounter: Secondary | ICD-10-CM | POA: Diagnosis not present

## 2022-05-17 DIAGNOSIS — S81002A Unspecified open wound, left knee, initial encounter: Secondary | ICD-10-CM | POA: Diagnosis not present

## 2022-05-17 MED ORDER — AMOXICILLIN-POT CLAVULANATE 875-125 MG PO TABS
1.0000 | ORAL_TABLET | Freq: Once | ORAL | Status: AC
Start: 2022-05-18 — End: 2022-05-18
  Administered 2022-05-18: 1 via ORAL
  Filled 2022-05-17: qty 1

## 2022-05-17 MED ORDER — TETANUS-DIPHTH-ACELL PERTUSSIS 5-2.5-18.5 LF-MCG/0.5 IM SUSY
0.5000 mL | PREFILLED_SYRINGE | Freq: Once | INTRAMUSCULAR | Status: AC
  Administered 2022-05-18: 0.5 mL via INTRAMUSCULAR
  Filled 2022-05-17: qty 0.5

## 2022-05-17 MED ORDER — AMOXICILLIN-POT CLAVULANATE 875-125 MG PO TABS: 1.0000 | ORAL_TABLET | Freq: Two times a day (BID) | ORAL | 0 refills | Status: DC

## 2022-05-17 NOTE — ED Triage Notes (Signed)
Pt states neighbor's dog bit pt to left wrist and left thigh and right and left knee. Pt has contacted [employer] and animal control has been notified.  Pt states she is going to have to rabies shots due to unknown dog shot status.

## 2022-05-18 NOTE — ED Provider Notes (Signed)
[facility] EMERGENCY DEPARTMENT Provider Note   CSN: [id number] Arrival date & time: 05/17/22  2202     History  Chief Complaint  Patient presents with   Animal Bite    Carolyn Allen is a 56 y.o. female.  The history is provided by the patient.  Animal Bite Contact animal:  Dog Pain details:    Quality:  Aching   Severity:  Moderate   Timing:  Constant Notifications:  Animal control and law enforcement Animal's rabies vaccination status:  Unknown Tetanus status:  Unknown Relieved by:  Nothing Patient presents for dog bite.  She reports that her neighbors dog attacked her and bit her on the left wrist, left thigh and right leg.  She has department animal control has been notified.  The dog has not been found but that is because the owner has not answered the door when law enforcement arrived.    Home Medications Prior to Admission medications   Medication Sig Start Date End Date Taking? Authorizing Provider  amoxicillin-clavulanate (AUGMENTIN) 875-125 MG tablet Take 1 tablet by mouth every 12 (twelve) hours. 05/17/22  Yes Zadie Rhine, MD  ALPRAZolam Prudy Feeler) 0.25 MG tablet Take 1 tablet (0.25 mg total) by mouth 3 (three) times daily as needed. for anxiety Patient taking differently: Take 0.25 mg by mouth at bedtime. And prn 03/05/16   Tonye Pearson, MD  amLODipine (NORVASC) 2.5 MG tablet Take 2.5 mg by mouth every morning.     [provider]  amLODipine (NORVASC) 2.5 MG tablet amlodipine 2.5 mg tablet 10/17/18   [provider]  amoxicillin (AMOXIL) 500 MG capsule Take 1 capsule (500 mg total) by mouth 3 (three) times daily. 03/17/22   Wallis Bamberg, PA-C  Ascorbic Acid (VITAMIN C PO) Take 1 tablet by mouth daily.    [provider]  ascorbic Acid (VITAMIN C) 500 MG CPCR Take by mouth.    [provider]  Calcium Carbonate-Vitamin D (CALCIUM + D PO) Take 1 tablet by mouth daily.    [provider]  cyclobenzaprine  (FLEXERIL) 10 MG tablet Take 1 tablet (10 mg total) by mouth 3 (three) times daily as needed. Patient taking differently: Take 20 mg by mouth 2 (two) times daily. 08/01/18   Joni Reining, PA-C  dicyclomine (BENTYL) 20 MG tablet Take 1 tablet (20 mg total) by mouth every 6 (six) hours. Patient taking differently: Take 20 mg by mouth 3 (three) times daily before meals. 03/05/16   Tonye Pearson, MD  DULoxetine (CYMBALTA) 20 MG capsule Take 20 mg by mouth every morning.     [provider]  HYDROcodone-acetaminophen (NORCO/VICODIN) 5-325 MG tablet Take by mouth. 11/20/21   [provider]  levocetirizine (XYZAL) 5 MG tablet Take 1 tablet (5 mg total) by mouth every evening. 03/17/22   Wallis Bamberg, PA-C  lisinopril (ZESTRIL) 40 MG tablet Take 40 mg by mouth every morning.     [provider]  lisinopril-hydrochlorothiazide (ZESTORETIC) 20-12.5 MG tablet Take 2 tablets by mouth daily. 03/11/22   [provider]  meloxicam (MOBIC) 7.5 MG tablet Take 1 tablet (7.5 mg total) by mouth 2 (two) times daily. 03/05/16   Tonye Pearson, MD  Multiple Vitamins-Minerals (MULTIVITAMIN WITH MINERALS) tablet Take 1 tablet by mouth daily.    [provider]  PARoxetine (PAXIL) 10 MG tablet Take 10 mg by mouth every morning.     [provider]  polyethylene glycol (MIRALAX / GLYCOLAX) 17 g packet Take  17 g by mouth daily as needed.    [provider]  potassium chloride (KLOR-CON) 8 MEQ tablet Take 16 mEq by mouth daily. 10/09/19   [provider]  predniSONE (DELTASONE) 20 MG tablet Take 2 tablets daily with breakfast. 03/17/22   Wallis Bamberg, PA-C  promethazine (PHENERGAN) 12.5 MG tablet Take 1 tablet (12.5 mg total) by mouth 4 (four) times daily as needed. 03/05/16   Tonye Pearson, MD  SUMAtriptan (IMITREX) 50 MG tablet Take 50 mg by mouth every 2 (two) hours as needed for migraine. May repeat in 2 hours if headache persists or recurs.     [provider]  traMADol (ULTRAM) 50 MG tablet TAKE 1 TABLET BY MOUTH EVERY 6 HOURS AS NEEDED. 06/19/16   Tonye Pearson, MD      Allergies    Codeine, Methocarbamol, Oxycodone, and Tizanidine    Review of Systems   Review of Systems  Physical Exam Updated Vital Signs BP (!) 131/95   Pulse 99   Temp (!) 97.5 F (36.4 C)   Resp 18   Ht 1.715 m (5' 7.5")   Wt 96 kg   SpO2 99%   BMI 32.67 kg/m  Physical Exam CONSTITUTIONAL: Well developed/well nourished HEAD: Normocephalic/atraumatic EYES: EOMI GU:no cva tenderness NEURO: Pt is awake/alert/appropriate, moves all extremitiesx4.  No facial droop.   EXTREMITIES: pulses normal/equal, full ROM Distal pulses intact Wounds noted to left upper extremity, left thigh left knee, and right knee.  See photo below SKIN: See photo PSYCH: Anxious       Patient gave verbal permission to utilize photo for medical documentation only The image was not stored on any personal device  ED Results / Procedures / Treatments   Labs (all labs ordered are listed, but only abnormal results are displayed) Labs Reviewed - No data to display  EKG None  Radiology No results found.  Procedures Procedures    Medications Ordered in ED Medications  Tdap (BOOSTRIX) injection 0.5 mL (0.5 mLs Intramuscular Given 05/18/22 0004)  amoxicillin-clavulanate (AUGMENTIN) 875-125 MG per tablet 1 tablet (1 tablet Oral Given 05/18/22 0005)    ED Course/ Medical Decision Making/ A&P                           Medical Decision Making Risk Prescription drug management.   Overall wounds are superficial.  She has full range of motion of all of her joints.  Wound care provided by nursing.  No indication for repair or imaging We will place her on Augmentin. Tdap was updated  Rabies status of dog is unknown, but it is her neighbors dog and animal control is involved. We will defer rabies vaccination for now unless there is any issues with the  dog but suspect that the dog with be placed in quarantine once the neighbor responds        Final Clinical Impression(s) / ED Diagnoses Final diagnoses:  Dog bite, initial encounter    Rx / DC Orders ED Discharge Orders          Ordered    amoxicillin-clavulanate (AUGMENTIN) 875-125 MG tablet  Every 12 hours        05/17/22 2353              Zadie Rhine, MD 05/18/22 [phone]

## 2022-05-18 NOTE — ED Notes (Signed)
Went over d/c papers. Gave extra dressing supplies. Verbalized understanding. Ambulatory to lobby.

## 2022-05-28 ENCOUNTER — Ambulatory Visit
Admission: EM | Admit: 2022-05-28 | Discharge: 2022-05-28 | Disposition: A | Discharge status: Home / Self Care | Payer: Commercial Managed Care - HMO

## 2022-05-28 DIAGNOSIS — S8012XD Contusion of left lower leg, subsequent encounter: Secondary | ICD-10-CM | POA: Diagnosis not present

## 2022-05-28 DIAGNOSIS — W540XXD Bitten by dog, subsequent encounter: Secondary | ICD-10-CM | POA: Diagnosis not present

## 2022-05-28 NOTE — ED Triage Notes (Signed)
Pt states she was was bit by a dog on 05/17/22 on left wrist, left and right knee and back of left thigh  Pt states that the bite on the back of the left thigh is hurting  Pt went to [facility] and they gave her a tetanus shot.

## 2022-05-28 NOTE — ED Provider Notes (Signed)
[facility]    CSN: [id number] Arrival date & time: 05/28/22  1128      History   Chief Complaint Chief Complaint  Patient presents with   Animal Bite    Recheck dog bite    HPI Carolyn Allen is a 56 y.o. female.   Presenting today following up on injuries from a dog attack that occurred on 05/17/2022.  Sustained injuries to the left wrist, left knee and posterior upper leg.  Was seen in the emergency department that day, given tetanus shot, started on Augmentin.  Completed the Augmentin and denies any redness, drainage, fevers, numbness, tingling.  Having significant firm swelling in the area of the worst bite left posterior leg that has been interfering with her work.    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 06/30/2016   OSA on CPAP 06/30/2016   Snoring 01/27/2016   Hypersomnia with sleep apnea 01/27/2016   Morbid obesity due to excess calories (HCC) 01/27/2016   Insomnia with sleep apnea 01/27/2016   Chronic pain syndrome 01/27/2016   Mitral valve prolapse 12/18/2014   Pelvic floor dysfunction 02/21/2014   Incontinence of feces 01/24/2014   Rectal fullness 01/24/2014   Unspecified constipation 01/08/2014   HTN (hypertension) 04/19/2012   Migraine headache 04/19/2012   IBS (irritable bowel syndrome) 04/19/2012   GAD (generalized anxiety disorder) 04/19/2012   Depression 04/19/2012   AR (allergic rhinitis) 04/19/2012   Chronic back pain  04/19/2012   Chronic neck pain 04/19/2012    Past Surgical History:  Procedure Laterality Date   ABDOMINAL HYSTERECTOMY     CYSTECTOMY     ovaries   JOINT REPLACEMENT     both knees   KNEE ARTHROSCOPY Left 11/21/2019   Procedure: ARTHROSCOPY KNEE WITH DEBRIDEMENT OF PAINFUL SCAR TISSUE STATUS POST A PRIOR TOTAL KNEE ARTHROPLASTY;  Surgeon: Christena Flake, MD;  Location: [facility];  Service: Orthopedics;  Laterality: Left;   SHOULDER SURGERY Right    SPINAL CORD STIMULATOR INSERTION N/A 10/08/2013   Procedure: LUMBAR SPINAL CORD STIMULATOR INSERTION;  Surgeon: Venita Lick, MD;  Location: [facility];  Service: Orthopedics;  Laterality: N/A;   SPINE SURGERY     TMJ ARTHROPLASTY      OB History     Gravida  1   Para  1   Term      Preterm  1   AB      Living  1      SAB      IAB      Ectopic      Multiple      Live Births               Home Medications    Prior to Admission medications   Medication Sig Start Date End Date Taking? Authorizing Provider  ALPRAZolam Prudy Feeler) 0.25 MG  tablet Take 1 tablet (0.25 mg total) by mouth 3 (three) times daily as needed. for anxiety Patient taking differently: Take 0.25 mg by mouth at bedtime. And prn 03/05/16   Tonye Pearson, MD  amLODipine (NORVASC) 2.5 MG tablet Take 2.5 mg by mouth every morning.     [provider]  amLODipine (NORVASC) 2.5 MG tablet amlodipine 2.5 mg tablet 10/17/18   [provider]  amoxicillin (AMOXIL) 500 MG capsule Take 1 capsule (500 mg total) by mouth 3 (three) times daily. 03/17/22   Wallis Bamberg, PA-C  amoxicillin-clavulanate (AUGMENTIN) 875-125 MG tablet Take 1 tablet by mouth every 12 (twelve) hours. 05/17/22   Zadie Rhine, MD  Ascorbic Acid (VITAMIN C PO) Take 1 tablet by mouth daily.    [provider]  ascorbic Acid (VITAMIN C) 500 MG CPCR Take by mouth.    [provider]  Calcium Carbonate-Vitamin D (CALCIUM + D PO) Take 1 tablet by mouth daily.     [provider]  cyclobenzaprine (FLEXERIL) 10 MG tablet Take 1 tablet (10 mg total) by mouth 3 (three) times daily as needed. Patient taking differently: Take 20 mg by mouth 2 (two) times daily. 08/01/18   Joni Reining, PA-C  dicyclomine (BENTYL) 20 MG tablet Take 1 tablet (20 mg total) by mouth every 6 (six) hours. Patient taking differently: Take 20 mg by mouth 3 (three) times daily before meals. 03/05/16   Tonye Pearson, MD  DULoxetine (CYMBALTA) 20 MG capsule Take 20 mg by mouth every morning.     [provider]  HYDROcodone-acetaminophen (NORCO/VICODIN) 5-325 MG tablet Take by mouth. 11/20/21   [provider]  levocetirizine (XYZAL) 5 MG tablet Take 1 tablet (5 mg total) by mouth every evening. 03/17/22   Wallis Bamberg, PA-C  lisinopril (ZESTRIL) 40 MG tablet Take 40 mg by mouth every morning.     [provider]  lisinopril-hydrochlorothiazide (ZESTORETIC) 20-12.5 MG tablet Take 2 tablets by mouth daily. 03/11/22   [provider]  meloxicam (MOBIC) 7.5 MG tablet Take 1 tablet (7.5 mg total) by mouth 2 (two) times daily. 03/05/16   Tonye Pearson, MD  Multiple Vitamins-Minerals (MULTIVITAMIN WITH MINERALS) tablet Take 1 tablet by mouth daily.    [provider]  PARoxetine (PAXIL) 10 MG tablet Take 10 mg by mouth every morning.     [provider]  polyethylene glycol (MIRALAX / GLYCOLAX) 17 g packet Take 17 g by mouth daily as needed.    [provider]  potassium chloride (KLOR-CON) 8 MEQ tablet Take 16 mEq by mouth daily. 10/09/19   [provider]  predniSONE (DELTASONE) 20 MG tablet Take 2 tablets daily with breakfast. 03/17/22   Wallis Bamberg, PA-C  promethazine (PHENERGAN) 12.5 MG tablet Take 1 tablet (12.5 mg total) by mouth 4 (four) times daily as needed. 03/05/16   Tonye Pearson, MD  SUMAtriptan (IMITREX) 50 MG tablet Take 50 mg by mouth every 2 (two) hours as needed for migraine. May repeat in  2 hours if headache persists or recurs.    [provider]  traMADol (ULTRAM) 50 MG tablet TAKE 1 TABLET BY MOUTH EVERY 6 HOURS AS NEEDED. 06/19/16   Tonye Pearson, MD    Family History Family History  Problem Relation Age of Onset   Cancer Mother        colon and lung   Cancer Father        lung   Heart disease Father  Heart disease Sister    Hypertension Sister    Diabetes Sister    Mental illness Sister    Fibromyalgia Sister     Social History Social History   Tobacco Use   Smoking status: Never   Smokeless tobacco: Never  Vaping Use   Vaping Use: Never used  Substance Use Topics   Alcohol use: No   Drug use: No     Allergies   Codeine, Methocarbamol, Oxycodone, and Tizanidine   Review of Systems Review of Systems Per HPI  Physical Exam Triage Vital Signs ED Triage Vitals  Enc Vitals Group     BP 05/28/22 1156 (!) 139/91     Pulse Rate 05/28/22 1156 (!) 108     Resp 05/28/22 1156 20     Temp 05/28/22 1156 99 F (37.2 C)     Temp Source 05/28/22 1156 Oral     SpO2 05/28/22 1156 95 %     Weight 05/28/22 1157 207 lb (93.9 kg)     Height --      Head Circumference --      Peak Flow --      Pain Score 05/28/22 1153 5     Pain Loc --      Pain Edu? --      Excl. in GC? --    No data found.  Updated Vital Signs BP (!) 139/91 (BP Location: Right Arm)   Pulse (!) 108   Temp 99 F (37.2 C) (Oral)   Resp 20   Wt 207 lb (93.9 kg)   SpO2 95%   BMI 31.94 kg/m   Visual Acuity Right Eye Distance:   Left Eye Distance:   Bilateral Distance:    Right Eye Near:   Left Eye Near:    Bilateral Near:     Physical Exam Vitals and nursing note reviewed.  Constitutional:      Appearance: Normal appearance. She is not ill-appearing.  HENT:     Head: Atraumatic.  Eyes:     Extraocular Movements: Extraocular movements intact.     Conjunctiva/sclera: Conjunctivae normal.  Cardiovascular:     Rate and Rhythm: Normal rate and regular  rhythm.     Heart sounds: Normal heart sounds.  Pulmonary:     Effort: Pulmonary effort is normal.     Breath sounds: Normal breath sounds.  Musculoskeletal:        General: Normal range of motion.     Cervical back: Normal range of motion and neck supple.  Skin:    General: Skin is warm.     Findings: Bruising present.     Comments: Scabbed puncture sites to the left posterior upper leg, lateral to the left knee from dog bites.  Firm hematoma surrounding the area of the puncture wounds left upper posterior leg.  No drainage, erythema, warmth, fluctuance, induration to the areas  Neurological:     Mental Status: She is alert and oriented to person, place, and time.     Comments: All 4 extremities neurovascularly intact  Psychiatric:        Mood and Affect: Mood normal.        Thought Content: Thought content normal.        Judgment: Judgment normal.      UC Treatments / Results  Labs (all labs ordered are listed, but only abnormal results are displayed) Labs Reviewed - No data to display  EKG   Radiology No results found.  Procedures Procedures (including critical care  time)  Medications Ordered in UC Medications - No data to display  Initial Impression / Assessment and Plan / UC Course  I have reviewed the triage vital signs and the nursing notes.  Pertinent labs & imaging results that were available during my care of the patient were reviewed by me and considered in my medical decision making (see chart for details).     Consistent with ongoing hematoma, no evidence of infection today, already received tetanus vaccine in the emergency department at day of onset.  Continue good home wound care, RICE protocol, over-the-counter pain relievers.  Return for worsening symptoms.  Final Clinical Impressions(s) / UC Diagnoses   Final diagnoses:  Hematoma of left lower extremity, subsequent encounter  Dog bite, subsequent encounter   Discharge Instructions   None     ED Prescriptions   None    PDMP not reviewed this encounter.   Particia Nearing, [place] 05/28/22 1209

## 2022-07-05 ENCOUNTER — Ambulatory Visit
Admission: EM | Admit: 2022-07-05 | Discharge: 2022-07-05 | Disposition: A | Discharge status: Home / Self Care | Payer: Commercial Managed Care - HMO | Attending: Family Medicine | Admitting: Family Medicine

## 2022-07-05 DIAGNOSIS — J3089 Other allergic rhinitis: Secondary | ICD-10-CM

## 2022-07-05 DIAGNOSIS — L309 Dermatitis, unspecified: Secondary | ICD-10-CM

## 2022-07-05 MED ORDER — LEVOCETIRIZINE DIHYDROCHLORIDE 5 MG PO TABS: 5.0000 mg | ORAL_TABLET | Freq: Every evening | ORAL | 0 refills | Status: DC

## 2022-07-05 MED ORDER — CLOBETASOL PROPIONATE 0.05 % EX OINT: 1.0000 | TOPICAL_OINTMENT | Freq: Two times a day (BID) | CUTANEOUS | 0 refills | Status: DC

## 2022-07-05 NOTE — ED Triage Notes (Signed)
Pt presents with rash and peeling skin on third finger for past month , developing on right finger

## 2022-07-09 NOTE — ED Provider Notes (Signed)
[facility]    CSN: [id number] Arrival date & time: 07/05/22  1806      History   Chief Complaint Chief Complaint  Patient presents with   Rash    HPI Carolyn Allen is a 56 y.o. female.   Presenting today with rash to the right hand that have been ongoing for the past month.  Peeling, thickened skin, burning sensation, itching.  Denies new products, medications, foods.  Has been trying moisturizers, over-the-counter creams with no relief.  No past history of similar issues.    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 06/30/2016   OSA on CPAP 06/30/2016   Snoring 01/27/2016   Hypersomnia with sleep apnea 01/27/2016   Morbid obesity due to excess calories (HCC) 01/27/2016   Insomnia with sleep apnea 01/27/2016   Chronic pain syndrome 01/27/2016   Mitral valve prolapse 12/18/2014   Pelvic floor dysfunction 02/21/2014   Incontinence of feces 01/24/2014   Rectal fullness 01/24/2014   Unspecified constipation 01/08/2014   HTN (hypertension) 04/19/2012   Migraine headache 04/19/2012   IBS (irritable bowel syndrome) 04/19/2012   GAD (generalized anxiety disorder) 04/19/2012   Depression 04/19/2012   AR (allergic rhinitis) 04/19/2012   Chronic back pain 04/19/2012   Chronic neck pain 04/19/2012    Past Surgical History:  Procedure Laterality Date   ABDOMINAL HYSTERECTOMY     CYSTECTOMY     ovaries   JOINT REPLACEMENT     both knees   KNEE  ARTHROSCOPY Left 11/21/2019   Procedure: ARTHROSCOPY KNEE WITH DEBRIDEMENT OF PAINFUL SCAR TISSUE STATUS POST A PRIOR TOTAL KNEE ARTHROPLASTY;  Surgeon: Christena Flake, MD;  Location: [facility];  Service: Orthopedics;  Laterality: Left;   SHOULDER SURGERY Right    SPINAL CORD STIMULATOR INSERTION N/A 10/08/2013   Procedure: LUMBAR SPINAL CORD STIMULATOR INSERTION;  Surgeon: Venita Lick, MD;  Location: [facility];  Service: Orthopedics;  Laterality: N/A;   SPINE SURGERY     TMJ ARTHROPLASTY      OB History     Gravida  1   Para  1   Term      Preterm  1   AB      Living  1      SAB      IAB      Ectopic      Multiple      Live Births               Home Medications    Prior to Admission medications   Medication Sig Start Date End Date Taking? Authorizing Provider  clobetasol ointment (TEMOVATE) 0.05 % Apply 1 Application topically 2 (two) times daily. 07/05/22  Yes Particia Nearing, PA-C  ALPRAZolam Prudy Feeler) 0.25 MG tablet Take 1 tablet (0.25 mg total) by mouth 3 (three) times daily as needed. for  anxiety Patient taking differently: Take 0.25 mg by mouth at bedtime. And prn 03/05/16   Tonye Pearson, MD  amLODipine (NORVASC) 2.5 MG tablet Take 2.5 mg by mouth every morning.     [provider]  amLODipine (NORVASC) 2.5 MG tablet amlodipine 2.5 mg tablet 10/17/18   [provider]  amoxicillin (AMOXIL) 500 MG capsule Take 1 capsule (500 mg total) by mouth 3 (three) times daily. 03/17/22   Wallis Bamberg, PA-C  amoxicillin-clavulanate (AUGMENTIN) 875-125 MG tablet Take 1 tablet by mouth every 12 (twelve) hours. 05/17/22   Zadie Rhine, MD  Ascorbic Acid (VITAMIN C PO) Take 1 tablet by mouth daily.    [provider]  ascorbic Acid (VITAMIN C) 500 MG CPCR Take by mouth.    [provider]  Calcium Carbonate-Vitamin D (CALCIUM + D PO) Take 1 tablet by mouth daily.    [provider]  cyclobenzaprine (FLEXERIL) 10 MG tablet  Take 1 tablet (10 mg total) by mouth 3 (three) times daily as needed. Patient taking differently: Take 20 mg by mouth 2 (two) times daily. 08/01/18   Joni Reining, PA-C  dicyclomine (BENTYL) 20 MG tablet Take 1 tablet (20 mg total) by mouth every 6 (six) hours. Patient taking differently: Take 20 mg by mouth 3 (three) times daily before meals. 03/05/16   Tonye Pearson, MD  DULoxetine (CYMBALTA) 20 MG capsule Take 20 mg by mouth every morning.     [provider]  HYDROcodone-acetaminophen (NORCO/VICODIN) 5-325 MG tablet Take by mouth. 11/20/21   [provider]  levocetirizine (XYZAL) 5 MG tablet Take 1 tablet (5 mg total) by mouth every evening. 07/05/22   Particia Nearing, PA-C  lisinopril (ZESTRIL) 40 MG tablet Take 40 mg by mouth every morning.     [provider]  lisinopril-hydrochlorothiazide (ZESTORETIC) 20-12.5 MG tablet Take 2 tablets by mouth daily. 03/11/22   [provider]  meloxicam (MOBIC) 7.5 MG tablet Take 1 tablet (7.5 mg total) by mouth 2 (two) times daily. 03/05/16   Tonye Pearson, MD  Multiple Vitamins-Minerals (MULTIVITAMIN WITH MINERALS) tablet Take 1 tablet by mouth daily.    [provider]  PARoxetine (PAXIL) 10 MG tablet Take 10 mg by mouth every morning.     [provider]  polyethylene glycol (MIRALAX / GLYCOLAX) 17 g packet Take 17 g by mouth daily as needed.    [provider]  potassium chloride (KLOR-CON) 8 MEQ tablet Take 16 mEq by mouth daily. 10/09/19   [provider]  predniSONE (DELTASONE) 20 MG tablet Take 2 tablets daily with breakfast. 03/17/22   Wallis Bamberg, PA-C  promethazine (PHENERGAN) 12.5 MG tablet Take 1 tablet (12.5 mg total) by mouth 4 (four) times daily as needed. 03/05/16   Tonye Pearson, MD  SUMAtriptan (IMITREX) 50 MG tablet Take 50 mg by mouth every 2 (two) hours as needed for migraine. May repeat in 2 hours if headache persists or recurs.    [provider]  traMADol (ULTRAM) 50 MG tablet TAKE 1 TABLET BY MOUTH EVERY 6 HOURS AS NEEDED. 06/19/16   Tonye Pearson, MD    Family History Family History  Problem Relation Age of Onset   Cancer Mother        colon and lung   Cancer Father        lung   Heart disease Father    Heart disease Sister    Hypertension Sister    Diabetes  Sister    Mental illness Sister    Fibromyalgia Sister     Social History Social History   Tobacco Use   Smoking status: Never   Smokeless tobacco: Never  Vaping Use   Vaping Use: Never used  Substance Use Topics   Alcohol use: No   Drug use: No     Allergies   Codeine, Methocarbamol, Oxycodone, and Tizanidine   Review of Systems Review of Systems Per HPI  Physical Exam Triage Vital Signs ED Triage Vitals  Enc Vitals Group     BP 07/05/22 1902 (!) 142/92     Pulse Rate 07/05/22 1902 78     Resp 07/05/22 1902 20     Temp 07/05/22 1902 98 F (36.7 C)     Temp src --      SpO2 07/05/22 1902 95 %     Weight --      Height --      Head Circumference --      Peak Flow --      Pain Score 07/05/22 1857 10     Pain Loc --      Pain Edu? --      Excl. in GC? --    No data found.  Updated Vital Signs BP (!) 142/92   Pulse 78   Temp 98 F (36.7 C)   Resp 20   SpO2 95%   Visual Acuity Right Eye Distance:   Left Eye Distance:   Bilateral Distance:    Right Eye Near:   Left Eye Near:    Bilateral Near:     Physical Exam Vitals and nursing note reviewed.  Constitutional:      Appearance: Normal appearance. She is not ill-appearing.  HENT:     Head: Atraumatic.     Mouth/Throat:     Mouth: Mucous membranes are moist.  Eyes:     Extraocular Movements: Extraocular movements intact.     Conjunctiva/sclera: Conjunctivae normal.  Cardiovascular:     Rate and Rhythm: Normal rate.  Pulmonary:     Effort: Pulmonary effort is normal. No respiratory distress.  Musculoskeletal:        General: Normal range of  motion.     Cervical back: Normal range of motion and neck supple.  Skin:    General: Skin is warm and dry.     Comments: Erythematous peeling thickened areas on right fingers  Neurological:     Mental Status: She is alert and oriented to person, place, and time.     Motor: No weakness.     Gait: Gait normal.  Psychiatric:        Mood and Affect: Mood normal.        Thought Content: Thought content normal.        Judgment: Judgment normal.      UC Treatments / Results  Labs (all labs ordered are listed, but only abnormal results are displayed) Labs Reviewed - No data to display  EKG   Radiology No results found.  Procedures Procedures (including critical care time)  Medications Ordered in UC Medications - No data to display  Initial Impression / Assessment and Plan / UC Course  I have reviewed the triage vital signs and the nursing notes.  Pertinent labs & imaging results that were available during my care of the patient were reviewed by me and considered in my medical decision making (see chart for details).     Most consistent with dyshidrotic eczema, treat with  clobetasol ointment, good moisturization, unscented products.  She is also requesting a refill on Xyzal for her seasonal allergies, refill was sent.  Return for worsening symptoms.  Final Clinical Impressions(s) / UC Diagnoses   Final diagnoses:  Hand dermatitis  Seasonal allergic rhinitis due to other allergic trigger   Discharge Instructions   None    ED Prescriptions     Medication Sig Dispense Auth. Provider   levocetirizine (XYZAL) 5 MG tablet Take 1 tablet (5 mg total) by mouth every evening. 90 tablet Roosvelt Maser [place], [place]   clobetasol ointment (TEMOVATE) 0.05 % Apply 1 Application topically 2 (two) times daily. 60 g Particia Nearing, [place]      PDMP not reviewed this encounter.   Particia Nearing, [place] 07/09/22 1918

## 2022-08-27 ENCOUNTER — Encounter: Payer: Self-pay | Admitting: Emergency Medicine

## 2022-08-27 ENCOUNTER — Ambulatory Visit (INDEPENDENT_AMBULATORY_CARE_PROVIDER_SITE_OTHER): Payer: Commercial Managed Care - HMO

## 2022-08-27 ENCOUNTER — Ambulatory Visit
Admission: EM | Admit: 2022-08-27 | Discharge: 2022-08-27 | Disposition: A | Discharge status: Home / Self Care | Payer: Commercial Managed Care - HMO | Attending: Nurse Practitioner | Admitting: Nurse Practitioner

## 2022-08-27 DIAGNOSIS — S8002XA Contusion of left knee, initial encounter: Secondary | ICD-10-CM

## 2022-08-27 DIAGNOSIS — S29011A Strain of muscle and tendon of front wall of thorax, initial encounter: Secondary | ICD-10-CM | POA: Diagnosis not present

## 2022-08-27 DIAGNOSIS — M25562 Pain in left knee: Secondary | ICD-10-CM

## 2022-08-27 NOTE — ED Provider Notes (Signed)
[facility]    CSN: [id number] Arrival date & time: 08/27/22  0804      History   Chief Complaint Chief Complaint  Patient presents with   Leg Pain    HPI Carolyn Allen is a 56 y.o. female.   The history is provided by the patient.   Patient presents for complaints of left knee pain after a fall that occurred 1 week ago.  Patient states that she landed directly on the left knee with her hands full.  She states that she also has some pain in her right rib cage.  She is unsure if she landed on her right side or if she stretched her arms forward during the fall.  She states that she has a history of bilateral knee replacements.  She states that she did have surgery on the left knee in 2020 to remove scar tissue.  She states that since the swelling has gone down after the fall, she has noticed that "something does not feel right" in the left knee.  She states that she is concerned that she has broken something.  She has tenderness to the knee and lateral aspect of the left knee.  She has pain with ambulating.  She states that is difficult for her to use the left leg to get in and out of her bed.  She states that she normally takes anti-inflammatories and pain medicine as her regular medications.  She has not taken any new medications for her symptoms. 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 06/30/2016   OSA on CPAP 06/30/2016   Snoring 01/27/2016   Hypersomnia with  sleep apnea 01/27/2016   Morbid obesity due to excess calories (HCC) 01/27/2016   Insomnia with sleep apnea 01/27/2016   Chronic pain syndrome 01/27/2016   Mitral valve prolapse 12/18/2014   Pelvic floor dysfunction 02/21/2014   Incontinence of feces 01/24/2014   Rectal fullness 01/24/2014   Unspecified constipation 01/08/2014   HTN (hypertension) 04/19/2012   Migraine headache 04/19/2012   IBS (irritable bowel syndrome) 04/19/2012   GAD (generalized anxiety disorder) 04/19/2012   Depression 04/19/2012   AR (allergic rhinitis) 04/19/2012   Chronic back pain 04/19/2012   Chronic neck pain 04/19/2012    Past Surgical History:  Procedure Laterality Date   ABDOMINAL HYSTERECTOMY     CYSTECTOMY     ovaries   JOINT REPLACEMENT     both knees   KNEE ARTHROSCOPY Left 11/21/2019   Procedure: ARTHROSCOPY KNEE WITH DEBRIDEMENT OF PAINFUL SCAR TISSUE STATUS POST A PRIOR TOTAL KNEE ARTHROPLASTY;  Surgeon: Christena Flake, MD;  Location: [facility];  Service: Orthopedics;  Laterality: Left;   SHOULDER SURGERY Right    SPINAL CORD STIMULATOR INSERTION N/A 10/08/2013   Procedure: LUMBAR SPINAL CORD STIMULATOR INSERTION;  Surgeon: Venita Lick, MD;  Location: [facility];  Service:  Orthopedics;  Laterality: N/A;   SPINE SURGERY     TMJ ARTHROPLASTY      OB History     Gravida  1   Para  1   Term      Preterm  1   AB      Living  1      SAB      IAB      Ectopic      Multiple      Live Births               Home Medications    Prior to Admission medications   Medication Sig Start Date End Date Taking? Authorizing Provider  ALPRAZolam (XANAX) 0.25 MG tablet Take 1 tablet (0.25 mg total) by mouth 3 (three) times daily as needed. for anxiety Patient taking differently: Take 0.25 mg by mouth at bedtime. And prn 03/05/16   Leandrew Koyanagi, MD  amLODipine (NORVASC) 2.5 MG tablet Take 2.5 mg by mouth every morning.     [provider]  amLODipine (NORVASC) 2.5 MG  tablet amlodipine 2.5 mg tablet 10/17/18   [provider]  amoxicillin (AMOXIL) 500 MG capsule Take 1 capsule (500 mg total) by mouth 3 (three) times daily. 03/17/22   Jaynee Eagles, PA-C  amoxicillin-clavulanate (AUGMENTIN) 875-125 MG tablet Take 1 tablet by mouth every 12 (twelve) hours. 05/17/22   Ripley Fraise, MD  Ascorbic Acid (VITAMIN C PO) Take 1 tablet by mouth daily.    [provider]  ascorbic Acid (VITAMIN C) 500 MG CPCR Take by mouth.    [provider]  Calcium Carbonate-Vitamin D (CALCIUM + D PO) Take 1 tablet by mouth daily.    [provider]  clobetasol ointment (TEMOVATE) [id number] % Apply 1 Application topically 2 (two) times daily. 07/05/22   Volney American, PA-C  cyclobenzaprine (FLEXERIL) 10 MG tablet Take 1 tablet (10 mg total) by mouth 3 (three) times daily as needed. Patient taking differently: Take 20 mg by mouth 2 (two) times daily. 08/01/18   Sable Feil, PA-C  dicyclomine (BENTYL) 20 MG tablet Take 1 tablet (20 mg total) by mouth every 6 (six) hours. Patient taking differently: Take 20 mg by mouth 3 (three) times daily before meals. 03/05/16   Leandrew Koyanagi, MD  DULoxetine (CYMBALTA) 20 MG capsule Take 20 mg by mouth every morning.     [provider]  HYDROcodone-acetaminophen (NORCO/VICODIN) 5-325 MG tablet Take by mouth. 11/20/21   [provider]  levocetirizine (XYZAL) 5 MG tablet Take 1 tablet (5 mg total) by mouth every evening. 07/05/22   Volney American, PA-C  lisinopril (ZESTRIL) 40 MG tablet Take 40 mg by mouth every morning.     [provider]  lisinopril-hydrochlorothiazide (ZESTORETIC) 20-12.5 MG tablet Take 2 tablets by mouth daily. 03/11/22   [provider]  meloxicam (MOBIC) 7.5 MG tablet Take 1 tablet (7.5 mg total) by mouth 2 (two) times daily. 03/05/16   Leandrew Koyanagi, MD  Multiple Vitamins-Minerals (MULTIVITAMIN WITH MINERALS) tablet Take 1 tablet by mouth  daily.    [provider]  PARoxetine (PAXIL) 10 MG tablet Take 10 mg by mouth every morning.     [provider]  polyethylene glycol (MIRALAX / GLYCOLAX) 17 g packet Take 17 g by mouth daily as needed.    [provider]  potassium chloride (KLOR-CON) 8 MEQ tablet Take 16 mEq by mouth daily. 10/09/19   [provider]  predniSONE (DELTASONE) 20 MG tablet Take 2 tablets daily with breakfast. 03/17/22   Jaynee Eagles, PA-C  promethazine (PHENERGAN) 12.5 MG tablet Take 1 tablet (12.5 mg total) by mouth 4 (four) times daily as needed. 03/05/16   Leandrew Koyanagi, MD  SUMAtriptan (IMITREX) 50 MG tablet Take 50 mg by mouth every 2 (two) hours as needed for migraine. May repeat in 2 hours if headache persists or recurs.    [provider]  traMADol (ULTRAM) 50 MG tablet TAKE 1 TABLET BY MOUTH EVERY 6 HOURS AS NEEDED. 06/19/16   Leandrew Koyanagi, MD    Family History Family History  Problem Relation Age of Onset   Cancer Mother        colon and lung   Cancer Father        lung   Heart disease Father    Heart disease Sister    Hypertension Sister    Diabetes Sister    Mental illness Sister    Fibromyalgia Sister     Social History Social History   Tobacco Use   Smoking status: Never   Smokeless tobacco: Never  Vaping Use   Vaping Use: Never used  Substance Use Topics   Alcohol use: No   Drug use: No     Allergies   Codeine, Methocarbamol, Oxycodone, and Tizanidine   Review of Systems Review of Systems Per HPI  Physical Exam Triage Vital Signs ED Triage Vitals  Enc Vitals Group     BP 08/27/22 0819 125/85     Pulse Rate 08/27/22 0819 76     Resp 08/27/22 0819 18     Temp 08/27/22 0819 98.1 F (36.7 C)     Temp src --      SpO2 08/27/22 0819 96 %     Weight --      Height --      Head Circumference --      Peak Flow --      Pain Score 08/27/22 0817 7     Pain Loc --      Pain Edu? --      Excl. in [place]? --    No data  found.  Updated Vital Signs BP 125/85   Pulse 76   Temp 98.1 F (36.7 C)   Resp 18   SpO2 96%   Visual Acuity Right Eye Distance:   Left Eye Distance:   Bilateral Distance:    Right Eye Near:   Left Eye Near:    Bilateral Near:     Physical Exam Vitals and nursing note reviewed.  Constitutional:      Appearance: Normal appearance. She is not toxic-appearing.  HENT:     Head: Normocephalic.  Eyes:     Extraocular Movements: Extraocular movements intact.     Conjunctiva/sclera: Conjunctivae normal.     Pupils: Pupils are equal, round, and reactive to light.  Cardiovascular:     Rate and Rhythm: Normal rate and regular rhythm.     Heart sounds: Normal heart sounds.  Pulmonary:     Effort: Pulmonary effort is normal. No respiratory distress.     Breath sounds: Normal breath sounds. No stridor. No wheezing, rhonchi or rales.  Chest:     Chest wall: Tenderness (Right chest wall between ribs 7 and 8) present.  Abdominal:     General: Bowel sounds are normal.     Palpations: Abdomen is soft.  Musculoskeletal:     Cervical back: Normal range of motion.  Left knee: Swelling present. Decreased range of motion. Tenderness present over the lateral joint line and patellar tendon.  Lymphadenopathy:     Cervical: No cervical adenopathy.  Skin:    General: Skin is warm and dry.  Neurological:     General: No focal deficit present.     Mental Status: She is alert and oriented to person, place, and time.  Psychiatric:        Mood and Affect: Mood normal.        Behavior: Behavior normal.      UC Treatments / Results  Labs (all labs ordered are listed, but only abnormal results are displayed) Labs Reviewed - No data to display  EKG   Radiology DG Knee Complete 4 Views Left  Result Date: 08/27/2022 CLINICAL DATA:  Left knee pain post fall 1 week ago. Previous left knee replacement. EXAM: LEFT KNEE - COMPLETE 4+ VIEW COMPARISON:  None Available. FINDINGS: Evidence of  patient's previous left total knee arthroplasty with prosthetic components intact and normally located. No joint effusion. No acute fracture or dislocation. IMPRESSION: No acute findings. Electronically Signed   By: Marin Olp M.D.   On: 08/27/2022 08:52    Procedures Procedures (including critical care time)  Medications Ordered in UC Medications - No data to display  Initial Impression / Assessment and Plan / UC Course  I have reviewed the triage vital signs and the nursing notes.  Pertinent labs & imaging results that were available during my care of the patient were reviewed by me and considered in my medical decision making (see chart for details)  Patient presents for complaints of left knee pain and right chest wall pain after a fall approximately 1 week ago.  Patient is concerned that she damaged the existing hardware to the left knee.  History of knee replacement in 2008 with laparoscopic surgery in 2020.  On exam, patient has tenderness to the patella tendon and lateral aspect of the left knee.  X-rays do not show any acute fractures, dislocation, or damage to the existing hardware in the left knee.  She does have right chest wall tenderness, she does not have any increased pain with coughing and deep breathing, mostly with twisting and turning.  Patient's left knee pain is consistent with a left knee contusion and right chest wall tenderness is consistent with chest wall strain.  Supportive care recommendations were provided to the patient to include RICE therapy.  Patient advised to continue her current pain management regiment.  Patient was advised to follow-up with orthopedics if symptoms fail to improve over the next 1 to 2 weeks.  Patient verbalizes understanding.  All questions were answered.  Final diagnoses:  Contusion of left knee, initial encounter  Muscle strain of chest wall, initial encounter     Discharge Instructions      The x-rays did not show any new fractures  or damage to the existing hardware in the right knee. Continue medications as you are currently taking to help with pain. Recommend the use of ice to help with pain or swelling.  Apply for 20 minutes, remove for 1 hour, then repeat as much as possible. Gentle stretching and range of motion exercises to help with muscle strain of the chest wall. RICE therapy, rest, ice, compression and elevation. As discussed, if your symptoms fail to improve over the next 1 to 2 weeks, recommend following up with orthopedics for further evaluation. Follow-up as needed.     ED Prescriptions  None    PDMP not reviewed this encounter.   Abran Cantor, NP 08/27/22 [phone]

## 2022-08-27 NOTE — Discharge Instructions (Addendum)
The x-rays did not show any new fractures or damage to the existing hardware in the right knee. Continue medications as you are currently taking to help with pain. Recommend the use of ice to help with pain or swelling.  Apply for 20 minutes, remove for 1 hour, then repeat as much as possible. Gentle stretching and range of motion exercises to help with muscle strain of the chest wall. RICE therapy, rest, ice, compression and elevation. As discussed, if your symptoms fail to improve over the next 1 to 2 weeks, recommend following up with orthopedics for further evaluation. Follow-up as needed.

## 2022-08-27 NOTE — ED Triage Notes (Addendum)
Pt is present today with c/o left leg pain from a fall. Pt states that her fall happened last week. Pt states that she tried to catch herself and landed on her knee

## 2022-10-11 ENCOUNTER — Encounter: Payer: Self-pay | Admitting: Emergency Medicine

## 2022-10-11 ENCOUNTER — Other Ambulatory Visit: Payer: Self-pay

## 2022-10-11 ENCOUNTER — Ambulatory Visit
Admission: EM | Admit: 2022-10-11 | Discharge: 2022-10-11 | Disposition: A | Discharge status: Home / Self Care | Payer: Commercial Managed Care - HMO | Attending: Family Medicine | Admitting: Family Medicine

## 2022-10-11 DIAGNOSIS — N611 Abscess of the breast and nipple: Secondary | ICD-10-CM | POA: Diagnosis not present

## 2022-10-11 MED ORDER — CEPHALEXIN 500 MG PO CAPS: 500.0000 mg | ORAL_CAPSULE | Freq: Two times a day (BID) | ORAL | 0 refills | Status: DC

## 2022-10-11 NOTE — ED Triage Notes (Signed)
Pt reports left breast pain and redness x2 days.pt reports red raised area with drainage since last night.

## 2022-10-11 NOTE — Discharge Instructions (Signed)
Apply warm compresses throughout the day, Neosporin and bandaging to the area and take full course of antibiotics.  Follow-up if worsening or not resolving

## 2022-10-11 NOTE — ED Provider Notes (Signed)
[facility]    CSN: [id number] Arrival date & time: 10/11/22  1715      History   Chief Complaint Chief Complaint  Patient presents with   Abscess    HPI Carolyn Allen is a 56 y.o. female.   Patient presenting today with left-sided breast pain that she first noticed yesterday or the day before.  She had noticed an area of redness and an opening that was draining pus yesterday and notes the area is even larger today.  Denies fever, chills, chest pain, shortness of breath, known injury to the area.  Has been keeping a dressing on the area due to the drainage but otherwise not trying anything over-the-counter for symptoms.    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 06/30/2016   OSA on CPAP 06/30/2016   Snoring 01/27/2016   Hypersomnia with sleep apnea 01/27/2016   Morbid obesity due to excess calories (HCC) 01/27/2016   Insomnia with sleep apnea 01/27/2016   Chronic pain syndrome 01/27/2016   Mitral valve prolapse 12/18/2014   Pelvic floor dysfunction 02/21/2014   Incontinence of feces 01/24/2014   Rectal fullness 01/24/2014   Unspecified constipation 01/08/2014   HTN (hypertension) 04/19/2012   Migraine headache 04/19/2012   IBS (irritable bowel syndrome) 04/19/2012   GAD (generalized anxiety disorder) 04/19/2012   Depression 04/19/2012   AR (allergic rhinitis) 04/19/2012   Chronic back pain 04/19/2012   Chronic neck pain 04/19/2012    Past  Surgical History:  Procedure Laterality Date   ABDOMINAL HYSTERECTOMY     CYSTECTOMY     ovaries   JOINT REPLACEMENT     both knees   KNEE ARTHROSCOPY Left 11/21/2019   Procedure: ARTHROSCOPY KNEE WITH DEBRIDEMENT OF PAINFUL SCAR TISSUE STATUS POST A PRIOR TOTAL KNEE ARTHROPLASTY;  Surgeon: Christena Flake, MD;  Location: [facility];  Service: Orthopedics;  Laterality: Left;   SHOULDER SURGERY Right    SPINAL CORD STIMULATOR INSERTION N/A 10/08/2013   Procedure: LUMBAR SPINAL CORD STIMULATOR INSERTION;  Surgeon: Venita Lick, MD;  Location: [facility];  Service: Orthopedics;  Laterality: N/A;   SPINE SURGERY     TMJ ARTHROPLASTY      OB History     Gravida  1   Para  1   Term      Preterm  1   AB      Living  1      SAB      IAB      Ectopic      Multiple      Live Births               Home Medications    Prior to Admission medications   Medication Sig Start Date End Date Taking? Authorizing Provider  cephALEXin (KEFLEX) 500 MG capsule Take 1 capsule (500 mg total) by  mouth 2 (two) times daily. 10/11/22  Yes Particia Nearing, PA-C  ALPRAZolam Prudy Feeler) 0.25 MG tablet Take 1 tablet (0.25 mg total) by mouth 3 (three) times daily as needed. for anxiety Patient taking differently: Take 0.25 mg by mouth at bedtime. And prn 03/05/16   Tonye Pearson, MD  amLODipine (NORVASC) 2.5 MG tablet Take 2.5 mg by mouth every morning.     [provider]  amLODipine (NORVASC) 2.5 MG tablet amlodipine 2.5 mg tablet 10/17/18   [provider]  amoxicillin (AMOXIL) 500 MG capsule Take 1 capsule (500 mg total) by mouth 3 (three) times daily. 03/17/22   Wallis Bamberg, PA-C  amoxicillin-clavulanate (AUGMENTIN) 875-125 MG tablet Take 1 tablet by mouth every 12 (twelve) hours. 05/17/22   Zadie Rhine, MD  Ascorbic Acid (VITAMIN C PO) Take 1 tablet by mouth daily.    [provider]  ascorbic Acid (VITAMIN C) 500 MG CPCR Take by mouth.    [provider]  Calcium Carbonate-Vitamin D (CALCIUM + D PO) Take 1 tablet by mouth daily.    [provider]  clobetasol ointment (TEMOVATE) 0.05 % Apply 1 Application topically 2 (two) times daily. 07/05/22   Particia Nearing, PA-C  cyclobenzaprine (FLEXERIL) 10 MG tablet Take 1 tablet (10 mg total) by mouth 3 (three) times daily as needed. Patient taking differently: Take 20 mg by mouth 2 (two) times daily. 08/01/18   Joni Reining, PA-C  dicyclomine (BENTYL) 20 MG tablet Take 1 tablet (20 mg total) by mouth every 6 (six) hours. Patient taking differently: Take 20 mg by mouth 3 (three) times daily before meals. 03/05/16   Tonye Pearson, MD  DULoxetine (CYMBALTA) 20 MG capsule Take 20 mg by mouth every morning.     [provider]  HYDROcodone-acetaminophen (NORCO/VICODIN) 5-325 MG tablet Take by mouth. 11/20/21   [provider]  levocetirizine (XYZAL) 5 MG tablet Take 1 tablet (5 mg total) by mouth every evening. 07/05/22   Particia Nearing, PA-C  lisinopril (ZESTRIL) 40 MG tablet Take 40 mg by mouth every morning.     [provider]  lisinopril-hydrochlorothiazide (ZESTORETIC) 20-12.5 MG tablet Take 2 tablets by mouth daily. 03/11/22   [provider]  meloxicam (MOBIC) 7.5 MG tablet Take 1 tablet (7.5 mg total) by mouth 2 (two) times daily. 03/05/16   Tonye Pearson, MD  Multiple Vitamins-Minerals (MULTIVITAMIN WITH MINERALS) tablet Take 1 tablet by mouth daily.    [provider]  PARoxetine (PAXIL) 10 MG tablet Take 10 mg by mouth every morning.     [provider]  polyethylene glycol (MIRALAX / GLYCOLAX) 17 g packet Take 17 g by mouth daily as needed.    [provider]  potassium chloride (KLOR-CON) 8 MEQ tablet Take 16 mEq by mouth daily. 10/09/19   [provider]  predniSONE (DELTASONE) 20 MG tablet Take 2 tablets daily with breakfast. 03/17/22   Wallis Bamberg, PA-C  promethazine (PHENERGAN) 12.5 MG  tablet Take 1 tablet (12.5 mg total) by mouth 4 (four) times daily as needed. 03/05/16   Tonye Pearson, MD  SUMAtriptan (IMITREX) 50 MG tablet Take 50 mg by mouth every 2 (two) hours as needed for migraine. May repeat in 2 hours if headache persists or recurs.    [provider]  traMADol (ULTRAM) 50 MG tablet TAKE 1 TABLET BY MOUTH EVERY 6 HOURS AS NEEDED. 06/19/16   Tonye Pearson, MD    Family History  Family History  Problem Relation Age of Onset   Cancer Mother        colon and lung   Cancer Father        lung   Heart disease Father    Heart disease Sister    Hypertension Sister    Diabetes Sister    Mental illness Sister    Fibromyalgia Sister     Social History Social History   Tobacco Use   Smoking status: Never   Smokeless tobacco: Never  Vaping Use   Vaping Use: Never used  Substance Use Topics   Alcohol use: No   Drug use: No     Allergies   Codeine, Methocarbamol, Oxycodone, and Tizanidine   Review of Systems Review of Systems Per HPI  Physical Exam Triage Vital Signs ED Triage Vitals  Enc Vitals Group     BP 10/11/22 1921 (!) 138/96     Pulse Rate 10/11/22 1921 98     Resp 10/11/22 1921 20     Temp 10/11/22 1921 99.1 F (37.3 C)     Temp Source 10/11/22 1921 Oral     SpO2 10/11/22 1921 98 %     Weight --      Height --      Head Circumference --      Peak Flow --      Pain Score 10/11/22 1912 6     Pain Loc --      Pain Edu? --      Excl. in [place]? --    No data found.  Updated Vital Signs BP (!) 138/96 (BP Location: Right Arm)   Pulse 98   Temp 99.1 F (37.3 C) (Oral)   Resp 20   SpO2 98%   Visual Acuity Right Eye Distance:   Left Eye Distance:   Bilateral Distance:    Right Eye Near:   Left Eye Near:    Bilateral Near:     Physical Exam Vitals and nursing note reviewed.  Constitutional:      Appearance: Normal appearance. She is not ill-appearing.  HENT:     Head: Atraumatic.  Eyes:     Extraocular  Movements: Extraocular movements intact.     Conjunctiva/sclera: Conjunctivae normal.  Cardiovascular:     Rate and Rhythm: Normal rate and regular rhythm.     Heart sounds: Normal heart sounds.  Pulmonary:     Effort: Pulmonary effort is normal.     Breath sounds: Normal breath sounds.  Musculoskeletal:        General: Normal range of motion.     Cervical back: Normal range of motion and neck supple.  Skin:    General: Skin is warm.     Findings: Erythema present.     Comments: Erythematous and edematous region 6:00 about 2 inches below the areola of the left breast with central ulcerated area, no active drainage or bleeding currently.  Tender to palpation, no fluctuance or induration  Neurological:     Mental Status: She is alert and oriented to person, place, and time.  Psychiatric:        Mood and Affect: Mood normal.        Thought Content: Thought content normal.        Judgment: Judgment normal.      UC Treatments / Results  Labs (all labs ordered are listed, but only abnormal results are displayed) Labs Reviewed - No data to display  EKG   Radiology No results found.  Procedures Procedures (including critical care time)  Medications Ordered in UC Medications - No data to display  Initial Impression / Assessment and Plan / UC Course  I have reviewed the triage vital signs and the nursing notes.  Pertinent labs & imaging results that were available during my care of the patient were reviewed by me and considered in my medical decision making (see chart for details).     Draining breast abscess, treat with Keflex, warm compresses, Neosporin and dressings.  Return for worsening symptoms.  Final Clinical Impressions(s) / UC Diagnoses   Final diagnoses:  Breast abscess     Discharge Instructions      Apply warm compresses throughout the day, Neosporin and bandaging to the area and take full course of antibiotics.  Follow-up if worsening or not  resolving    ED Prescriptions     Medication Sig Dispense Auth. Provider   cephALEXin (KEFLEX) 500 MG capsule Take 1 capsule (500 mg total) by mouth 2 (two) times daily. 20 capsule Volney American, [place]      PDMP not reviewed this encounter.   Volney American, [place] 10/11/22 1932

## 2023-02-14 ENCOUNTER — Ambulatory Visit
Admission: EM | Admit: 2023-02-14 | Discharge: 2023-02-14 | Disposition: A | Discharge status: Home / Self Care | Payer: Commercial Managed Care - HMO | Attending: Nurse Practitioner | Admitting: Nurse Practitioner

## 2023-02-14 DIAGNOSIS — K121 Other forms of stomatitis: Secondary | ICD-10-CM | POA: Insufficient documentation

## 2023-02-14 DIAGNOSIS — J029 Acute pharyngitis, unspecified: Secondary | ICD-10-CM | POA: Diagnosis present

## 2023-02-14 LAB — POCT RAPID STREP A (OFFICE): Rapid Strep A Screen: NEGATIVE

## 2023-02-14 MED ORDER — AMOXICILLIN 500 MG PO CAPS: 500.0000 mg | ORAL_CAPSULE | Freq: Two times a day (BID) | ORAL | 0 refills | Status: AC

## 2023-02-14 MED ORDER — LIDOCAINE VISCOUS HCL 2 % MT SOLN: 15.0000 mL | OROMUCOSAL | 0 refills | Status: DC | PRN

## 2023-02-14 NOTE — ED Provider Notes (Signed)
[facility]    CSN: [id number] Arrival date & time: 02/14/23  1639      History   Chief Complaint No chief complaint on file.   HPI Carolyn Allen is a 57 y.o. female.   Patient presents today for painful swallowing, sore on the roof of her mouth at the back of her dentures, and right neck pain for the past week.  Reports about 1 week ago, she got her flu and COVID-19 shots and she had a fever, bodies, and chills a couple of days after.  She reports all the symptoms are now improved and now she has a sore throat.  No cough, congestion, runny or stuffy nose.  No chest pain or shortness of breath.  No abdominal pain, nausea/vomiting, or diarrhea.  She is having difficulty swallowing because it hurts to swallow and talk.  No known sick contacts.  Has not take anything for symptoms so far.  Patient denies antibiotic use in the past 90 days.    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 06/30/2016   OSA on CPAP 06/30/2016   Snoring 01/27/2016   Hypersomnia with sleep apnea 01/27/2016   Morbid obesity due to excess calories ([place]) 01/27/2016   Insomnia with sleep apnea 01/27/2016   Chronic pain syndrome 01/27/2016   Mitral valve prolapse 12/18/2014   Pelvic floor dysfunction 02/21/2014   Incontinence of feces 01/24/2014   Rectal fullness 01/24/2014   Unspecified constipation 01/08/2014   HTN (hypertension) 04/19/2012   Migraine headache 04/19/2012   IBS  (irritable bowel syndrome) 04/19/2012   GAD (generalized anxiety disorder) 04/19/2012   Depression 04/19/2012   AR (allergic rhinitis) 04/19/2012   Chronic back pain 04/19/2012   Chronic neck pain 04/19/2012    Past Surgical History:  Procedure Laterality Date   ABDOMINAL HYSTERECTOMY     CYSTECTOMY     ovaries   JOINT REPLACEMENT     both knees   KNEE ARTHROSCOPY Left 11/21/2019   Procedure: ARTHROSCOPY KNEE WITH DEBRIDEMENT OF PAINFUL SCAR TISSUE STATUS POST A PRIOR TOTAL KNEE ARTHROPLASTY;  Surgeon: Corky Mull, MD;  Location: [facility];  Service: Orthopedics;  Laterality: Left;   SHOULDER SURGERY Right    SPINAL CORD STIMULATOR INSERTION N/A 10/08/2013   Procedure: LUMBAR SPINAL CORD STIMULATOR INSERTION;  Surgeon: Melina Schools, MD;  Location: [place];  Service: Orthopedics;  Laterality: N/A;   SPINE SURGERY     TMJ ARTHROPLASTY      OB History     Gravida  1   Para  1   Term      Preterm  1   AB      Living  1      SAB      IAB      Ectopic      Multiple  Live Births               Home Medications    Prior to Admission medications   Medication Sig Start Date End Date Taking? Authorizing Provider  amoxicillin (AMOXIL) 500 MG capsule Take 1 capsule (500 mg total) by mouth 2 (two) times daily for 10 days. 02/14/23 02/24/23 Yes Noemi Chapel A, NP  lidocaine (XYLOCAINE) 2 % solution Use as directed 15 mLs in the mouth or throat as needed for mouth pain. Dab on mouth ulcer as needed for mouth pain 02/14/23  Yes Eulogio Bear, NP  ALPRAZolam Duanne Moron) 0.25 MG tablet Take 1 tablet (0.25 mg total) by mouth 3 (three) times daily as needed. for anxiety Patient taking differently: Take 0.25 mg by mouth at bedtime. And prn 03/05/16   Leandrew Koyanagi, MD  amLODipine (NORVASC) 2.5 MG tablet Take 2.5 mg by mouth every morning.     [provider]  amLODipine (NORVASC) 2.5 MG tablet amlodipine 2.5 mg tablet 10/17/18   [provider]   Ascorbic Acid (VITAMIN C PO) Take 1 tablet by mouth daily.    [provider]  ascorbic Acid (VITAMIN C) 500 MG CPCR Take by mouth.    [provider]  Calcium Carbonate-Vitamin D (CALCIUM + D PO) Take 1 tablet by mouth daily.    [provider]  clobetasol ointment (TEMOVATE) [id number] % Apply 1 Application topically 2 (two) times daily. 07/05/22   Volney American, PA-C  cyclobenzaprine (FLEXERIL) 10 MG tablet Take 1 tablet (10 mg total) by mouth 3 (three) times daily as needed. Patient taking differently: Take 20 mg by mouth 2 (two) times daily. 08/01/18   Sable Feil, PA-C  dicyclomine (BENTYL) 20 MG tablet Take 1 tablet (20 mg total) by mouth every 6 (six) hours. Patient taking differently: Take 20 mg by mouth 3 (three) times daily before meals. 03/05/16   Leandrew Koyanagi, MD  DULoxetine (CYMBALTA) 20 MG capsule Take 20 mg by mouth every morning.     [provider]  HYDROcodone-acetaminophen (NORCO/VICODIN) 5-325 MG tablet Take by mouth. 11/20/21   [provider]  levocetirizine (XYZAL) 5 MG tablet Take 1 tablet (5 mg total) by mouth every evening. 07/05/22   Volney American, PA-C  lisinopril (ZESTRIL) 40 MG tablet Take 40 mg by mouth every morning.     [provider]  lisinopril-hydrochlorothiazide (ZESTORETIC) 20-12.5 MG tablet Take 2 tablets by mouth daily. 03/11/22   [provider]  meloxicam (MOBIC) 7.5 MG tablet Take 1 tablet (7.5 mg total) by mouth 2 (two) times daily. 03/05/16   Leandrew Koyanagi, MD  Multiple Vitamins-Minerals (MULTIVITAMIN WITH MINERALS) tablet Take 1 tablet by mouth daily.    [provider]  PARoxetine (PAXIL) 10 MG tablet Take 10 mg by mouth every morning.     [provider]  polyethylene glycol (MIRALAX / GLYCOLAX) 17 g packet Take 17 g by mouth daily as needed.    [provider]  potassium chloride (KLOR-CON) 8 MEQ tablet Take 16 mEq by mouth daily.  10/09/19   [provider]  promethazine (PHENERGAN) 12.5 MG tablet Take 1 tablet (12.5 mg total) by mouth 4 (four) times daily as needed. 03/05/16   Leandrew Koyanagi, MD  SUMAtriptan (IMITREX) 50 MG tablet Take 50 mg by mouth every 2 (two) hours as needed for migraine. May repeat in 2 hours if headache persists or recurs.    [provider]  traMADol (  ULTRAM) 50 MG tablet TAKE 1 TABLET BY MOUTH EVERY 6 HOURS AS NEEDED. 06/19/16   Leandrew Koyanagi, MD    Family History Family History  Problem Relation Age of Onset   Cancer Mother        colon and lung   Cancer Father        lung   Heart disease Father    Heart disease Sister    Hypertension Sister    Diabetes Sister    Mental illness Sister    Fibromyalgia Sister     Social History Social History   Tobacco Use   Smoking status: Never   Smokeless tobacco: Never  Vaping Use   Vaping Use: Never used  Substance Use Topics   Alcohol use: No   Drug use: No     Allergies   Pholcodine, Codeine, Methocarbamol, Oxycodone, and Tizanidine   Review of Systems Review of Systems Per HPI  Physical Exam Triage Vital Signs ED Triage Vitals  Enc Vitals Group     BP 02/14/23 1754 116/80     Pulse Rate 02/14/23 1754 85     Resp 02/14/23 1754 20     Temp 02/14/23 1754 98.2 F (36.8 C)     Temp Source 02/14/23 1754 Oral     SpO2 02/14/23 1754 96 %     Weight --      Height --      Head Circumference --      Peak Flow --      Pain Score 02/14/23 1800 5     Pain Loc --      Pain Edu? --      Excl. in [place]? --    No data found.  Updated Vital Signs BP 116/80 (BP Location: Right Arm)   Pulse 85   Temp 98.2 F (36.8 C) (Oral)   Resp 20   SpO2 96%   Visual Acuity Right Eye Distance:   Left Eye Distance:   Bilateral Distance:    Right Eye Near:   Left Eye Near:    Bilateral Near:     Physical Exam Vitals and nursing note reviewed.  Constitutional:      General: She is not in acute distress.     Appearance: She is well-developed. She is not toxic-appearing.  HENT:     Head: Normocephalic and atraumatic.     Right Ear: Tympanic membrane, ear canal and external ear normal. No drainage or swelling. No middle ear effusion. There is no impacted cerumen. Tympanic membrane is not erythematous.     Left Ear: Tympanic membrane, ear canal and external ear normal. No drainage, swelling or tenderness.  No middle ear effusion. There is no impacted cerumen. Tympanic membrane is not erythematous.     Nose: No congestion or rhinorrhea.     Mouth/Throat:     Mouth: Mucous membranes are moist.     Dentition: Has dentures.     Palate: Lesions present.     Pharynx: Oropharynx is clear. Uvula midline. Posterior oropharyngeal erythema present. No oropharyngeal exudate.     Tonsils: No tonsillar exudate. 0 on the right. 0 on the left.      Comments: Lesion to posterior palate in approximately area marked; area appears ulcerated and slightly erythematous. Eyes:     General: No scleral icterus.    Extraocular Movements: Extraocular movements intact.     Right eye: Normal extraocular motion.     Left eye: Normal extraocular motion.  Cardiovascular:  Rate and Rhythm: Normal rate and regular rhythm.  Pulmonary:     Effort: Pulmonary effort is normal. No respiratory distress.     Breath sounds: Normal breath sounds. No wheezing, rhonchi or rales.  Musculoskeletal:     Cervical back: Normal range of motion and neck supple.  Lymphadenopathy:     Cervical: Cervical adenopathy present.  Skin:    General: Skin is warm and dry.     Capillary Refill: Capillary refill takes less than 2 seconds.     Coloration: Skin is not pale.     Findings: No erythema or rash.  Neurological:     Mental Status: She is alert and oriented to person, place, and time.  Psychiatric:        Behavior: Behavior is cooperative.      UC Treatments / Results  Labs (all labs ordered are listed, but only abnormal results are  displayed) Labs Reviewed  CULTURE, GROUP A STREP [facility])  POCT RAPID STREP A (OFFICE)    EKG   Radiology No results found.  Procedures Procedures (including critical care time)  Medications Ordered in UC Medications - No data to display  Initial Impression / Assessment and Plan / UC Course  I have reviewed the triage vital signs and the nursing notes.  Pertinent labs & imaging results that were available during my care of the patient were reviewed by me and considered in my medical decision making (see chart for details).   Patient is well-appearing, normotensive, afebrile, not tachycardic, not tachypneic, oxygenating well on room air.    1. Acute pharyngitis, unspecified etiology Rapid strep negative, however I am suspicious for strep throat given history and examination today Throat culture pending Treat with amoxicillin twice daily for 10 days Change toothbrush after starting treatment  ER and return precautions discussed if symptoms do not improve or if they worsen  2. Stomatitis Start topical lidocaine to help with pain  Suspect this will improve over the next couple of days Recommended avoidance of dentures in the meantime  The patient was given the opportunity to ask questions.  All questions answered to their satisfaction.  The patient is in agreement to this plan.    Final Clinical Impressions(s) / UC Diagnoses   Final diagnoses:  Acute pharyngitis, unspecified etiology  Stomatitis     Discharge Instructions      As we discussed I suspect he may have strep throat.  Please take the medicine as prescribed called amoxicillin to treat the bacteria.  You can apply the lidocaine to the top of your mouth as needed for mouth pain-this will numb your mouth.  Make sure you take the entire course of amoxicillin to treat possible strep throat.  Make sure you change your toothbrush today or tomorrow to prevent reinfection.  Seek care if symptoms persist or worsen despite  treatment.   ED Prescriptions     Medication Sig Dispense Auth. Provider   lidocaine (XYLOCAINE) 2 % solution Use as directed 15 mLs in the mouth or throat as needed for mouth pain. Dab on mouth ulcer as needed for mouth pain 100 mL Noemi Chapel A, NP   amoxicillin (AMOXIL) 500 MG capsule Take 1 capsule (500 mg total) by mouth 2 (two) times daily for 10 days. 20 capsule Eulogio Bear, NP      PDMP not reviewed this encounter.   Eulogio Bear, NP 02/14/23 1827

## 2023-02-14 NOTE — Discharge Instructions (Addendum)
As we discussed I suspect he may have strep throat.  Please take the medicine as prescribed called amoxicillin to treat the bacteria.  You can apply the lidocaine to the top of your mouth as needed for mouth pain-this will numb your mouth.  Make sure you take the entire course of amoxicillin to treat possible strep throat.  Make sure you change your toothbrush today or tomorrow to prevent reinfection.  Seek care if symptoms persist or worsen despite treatment.

## 2023-02-14 NOTE — ED Triage Notes (Signed)
Pt reports she is experiencing pain when swallowing at the back of her dentures and it radiates down to her throat x 1 week. Her neck is painful to the touch.

## 2023-02-17 LAB — CULTURE, GROUP A STREP (THRC)

## 2023-03-18 ENCOUNTER — Encounter (HOSPITAL_COMMUNITY): Payer: Self-pay

## 2023-03-18 ENCOUNTER — Other Ambulatory Visit: Payer: Self-pay

## 2023-03-18 ENCOUNTER — Emergency Department (HOSPITAL_COMMUNITY)
Admission: EM | Admit: 2023-03-18 | Discharge: 2023-03-19 | Disposition: A | Discharge status: Home / Self Care | Payer: Commercial Managed Care - HMO | Attending: Emergency Medicine | Admitting: Emergency Medicine

## 2023-03-18 DIAGNOSIS — R531 Weakness: Secondary | ICD-10-CM | POA: Diagnosis present

## 2023-03-18 DIAGNOSIS — I10 Essential (primary) hypertension: Secondary | ICD-10-CM | POA: Insufficient documentation

## 2023-03-18 DIAGNOSIS — Z79899 Other long term (current) drug therapy: Secondary | ICD-10-CM | POA: Insufficient documentation

## 2023-03-18 DIAGNOSIS — E876 Hypokalemia: Secondary | ICD-10-CM | POA: Diagnosis not present

## 2023-03-18 LAB — CBC
HCT: 35.3 % — ABNORMAL LOW (ref 36.0–46.0)
Hemoglobin: 12.3 g/dL (ref 12.0–15.0)
MCH: 31.1 pg (ref 26.0–34.0)
MCHC: 34.8 g/dL (ref 30.0–36.0)
MCV: 89.1 fL (ref 80.0–100.0)
Platelets: 243 10*3/uL (ref 150–400)
RBC: 3.96 MIL/uL (ref 3.87–5.11)
RDW: 13.5 % (ref 11.5–15.5)
WBC: 5.3 10*3/uL (ref 4.0–10.5)
nRBC: 0 % (ref 0.0–0.2)

## 2023-03-18 LAB — BASIC METABOLIC PANEL
Anion gap: 10 (ref 5–15)
BUN: 12 mg/dL (ref 6–20)
CO2: 29 mmol/L (ref 22–32)
Calcium: 8.9 mg/dL (ref 8.9–10.3)
Chloride: 98 mmol/L (ref 98–111)
Creatinine, Ser: 0.86 mg/dL (ref 0.44–1.00)
GFR, Estimated: >60 mL/min (ref >60)
Glucose, Bld: 138 mg/dL — ABNORMAL HIGH (ref 70–99)
Potassium: 2.4 mmol/L — CL (ref 3.5–5.1)
Sodium: 137 mmol/L (ref 135–145)

## 2023-03-18 LAB — MAGNESIUM: Magnesium: 2 mg/dL (ref 1.7–2.4)

## 2023-03-18 MED ORDER — POTASSIUM CHLORIDE CRYS ER 20 MEQ PO TBCR
40.0000 meq | EXTENDED_RELEASE_TABLET | Freq: Once | ORAL | Status: AC
  Administered 2023-03-18: 40 meq via ORAL
  Filled 2023-03-18: qty 2

## 2023-03-18 MED ORDER — POTASSIUM CHLORIDE 10 MEQ/100ML IV SOLN
10.0000 meq | INTRAVENOUS | Status: AC
  Administered 2023-03-18 (×3): 10 meq via INTRAVENOUS
  Filled 2023-03-18 (×3): qty 100

## 2023-03-18 NOTE — ED Triage Notes (Signed)
Pt present with hypokalemia after getting blood drawn at doctors office this morning. Hx of low potassium in the past and takes supplements at home.    Potassium is 2.8

## 2023-03-18 NOTE — ED Notes (Signed)
AC to bring PO potassium from pharmacy.

## 2023-03-18 NOTE — ED Provider Notes (Signed)
[place] EMERGENCY DEPARTMENT AT [facility] Provider Note   CSN: [id number] Arrival date & time: 03/18/23  1900     History {Add pertinent medical, surgical, social history, OB history to HPI:1} No chief complaint on file.   Carolyn Allen is a 57 y.o. female.  HPI     Carolyn Allen is a 57 y.o. female with past medical history of hypertension, migraine headaches, generalized anxiety disorder, who presents to the Emergency Department requesting evaluation for possible hypokalemia.  She was seen at PCPs office at [facility] earlier today had labs drawn and was contacted later advising to come to the emergency department for potassium of 2.8.  She states that she has had low potassium in the past but never this low.  States her potassium was 3.9 in December.  She reports having increased depression and stressors due to the death of her fianc and was started on Seroquel 2 months ago.  Recently, she states she has been experiencing weakness of her muscles.  PCP advised her to discontinue her lisinopril/hydrochlorothiazide.  She denies chest pain or shortness of breath.  Home Medications Prior to Admission medications   Medication Sig Start Date End Date Taking? Authorizing Provider  ALPRAZolam (XANAX) 0.25 MG tablet Take 1 tablet (0.25 mg total) by mouth 3 (three) times daily as needed. for anxiety Patient taking differently: Take 0.25 mg by mouth at bedtime. And prn 03/05/16   Tonye Pearson, MD  amLODipine (NORVASC) 2.5 MG tablet Take 2.5 mg by mouth every morning.     [provider]  amLODipine (NORVASC) 2.5 MG tablet amlodipine 2.5 mg tablet 10/17/18   [provider]  Ascorbic Acid (VITAMIN C PO) Take 1 tablet by mouth daily.    [provider]  ascorbic Acid (VITAMIN C) 500 MG CPCR Take by mouth.    [provider]  Calcium Carbonate-Vitamin D (CALCIUM + D PO) Take 1 tablet by mouth daily.    [provider]  clobetasol  ointment (TEMOVATE) 0.05 % Apply 1 Application topically 2 (two) times daily. 07/05/22   Particia Nearing, PA-C  cyclobenzaprine (FLEXERIL) 10 MG tablet Take 1 tablet (10 mg total) by mouth 3 (three) times daily as needed. Patient taking differently: Take 20 mg by mouth 2 (two) times daily. 08/01/18   Joni Reining, PA-C  dicyclomine (BENTYL) 20 MG tablet Take 1 tablet (20 mg total) by mouth every 6 (six) hours. Patient taking differently: Take 20 mg by mouth 3 (three) times daily before meals. 03/05/16   Tonye Pearson, MD  DULoxetine (CYMBALTA) 20 MG capsule Take 20 mg by mouth every morning.     [provider]  HYDROcodone-acetaminophen (NORCO/VICODIN) 5-325 MG tablet Take by mouth. 11/20/21   [provider]  levocetirizine (XYZAL) 5 MG tablet Take 1 tablet (5 mg total) by mouth every evening. 07/05/22   Particia Nearing, PA-C  lidocaine (XYLOCAINE) 2 % solution Use as directed 15 mLs in the mouth or throat as needed for mouth pain. Dab on mouth ulcer as needed for mouth pain 02/14/23   Cathlean Marseilles A, NP  lisinopril (ZESTRIL) 40 MG tablet Take 40 mg by mouth every morning.     [provider]  lisinopril-hydrochlorothiazide (ZESTORETIC) 20-12.5 MG tablet Take 2 tablets by mouth daily. 03/11/22   [provider]  meloxicam (MOBIC) 7.5 MG tablet Take 1 tablet (7.5 mg total) by mouth 2 (two) times daily. 03/05/16   Tonye Pearson, MD  Multiple Vitamins-Minerals (MULTIVITAMIN WITH MINERALS) tablet Take 1 tablet by mouth daily.    [provider]  PARoxetine (PAXIL) 10 MG tablet Take 10 mg by mouth every morning.     [provider]  polyethylene glycol (MIRALAX / GLYCOLAX) 17 g packet Take 17 g by mouth daily as needed.    [provider]  potassium chloride (KLOR-CON) 8 MEQ tablet Take 16 mEq by mouth daily. 10/09/19   [provider]  promethazine (PHENERGAN) 12.5 MG tablet Take 1 tablet (12.5 mg total) by  mouth 4 (four) times daily as needed. 03/05/16   Tonye Pearsonoolittle, Robert P, MD  SUMAtriptan (IMITREX) 50 MG tablet Take 50 mg by mouth every 2 (two) hours as needed for migraine. May repeat in 2 hours if headache persists or recurs.    [provider]  traMADol (ULTRAM) 50 MG tablet TAKE 1 TABLET BY MOUTH EVERY 6 HOURS AS NEEDED. 06/19/16   Tonye Pearsonoolittle, Robert P, MD      Allergies    Pholcodine, Codeine, Methocarbamol, Oxycodone, and Tizanidine    Review of Systems   Review of Systems  Constitutional:  Negative for chills and fever.  Respiratory:  Negative for shortness of breath.   Cardiovascular:  Negative for chest pain.  Gastrointestinal:  Negative for abdominal pain, diarrhea, nausea and vomiting.  Musculoskeletal:  Negative for myalgias.  Neurological:  Positive for weakness. Negative for dizziness, seizures, numbness and headaches.    Physical Exam Updated Vital Signs BP 116/75   Pulse 64   Temp 98 F (36.7 C)   Resp 19   Wt 95.3 kg   SpO2 96%   BMI 32.41 kg/m  Physical Exam Vitals and nursing note reviewed.  Constitutional:      General: She is not in acute distress.    Appearance: Normal appearance. She is not ill-appearing or toxic-appearing.  HENT:     Mouth/Throat:     Mouth: Mucous membranes are moist.  Eyes:     Extraocular Movements: Extraocular movements intact.     Conjunctiva/sclera: Conjunctivae normal.     Pupils: Pupils are equal, round, and reactive to light.  Cardiovascular:     Rate and Rhythm: Normal rate and regular rhythm.     Pulses: Normal pulses.  Pulmonary:     Effort: Pulmonary effort is normal.  Abdominal:     Palpations: Abdomen is soft.     Tenderness: There is no abdominal tenderness.  Musculoskeletal:     Cervical back: Normal range of motion.     Right lower leg: No edema.     Left lower leg: No edema.  Skin:    General: Skin is warm.     Capillary Refill: Capillary refill takes less than 2 seconds.  Neurological:      General: No focal deficit present.     Mental Status: She is alert.     Sensory: No sensory deficit.     Motor: No weakness.    ED Results / Procedures / Treatments   Labs (all labs ordered are listed, but only abnormal results are displayed) Labs Reviewed  CBC - Abnormal; Notable for the following components:      Result Value   HCT 35.3 (*)    All other components within normal limits  BASIC METABOLIC PANEL - Abnormal; Notable for the following components:   Potassium 2.4 (*)    Glucose, Bld 138 (*)    All other components within normal limits  MAGNESIUM    EKG None  Radiology No results found.  Procedures Procedures  {Document cardiac monitor, telemetry assessment procedure when appropriate:1}  Medications Ordered in ED Medications  potassium chloride 10 mEq in 100 mL IVPB (10 mEq Intravenous New Bag/Given 03/18/23 2159)  potassium chloride SA (KLOR-CON M) CR tablet 40 mEq (40 mEq Oral Given 03/18/23 2100)    ED Course/ Medical Decision Making/ A&P   {   Click here for ABCD2, HEART and other calculatorsREFRESH Note before signing :1}                          Medical Decision Making Patient here for evaluation of possible hypokalemia.  History of same as she has been on lisinopril/hydrochlorothiazide for her hypertension.  She was contacted by PCP earlier today and reported to have had potassium of 2.8.  Started Seroquel 2 months ago.  Complaining of generalized weakness of her muscles.  We will recheck her potassium level this evening.  If she is indeed hypokalemic will give oral and IV potassium, check other chemistries EKG.  Patient recently started on Seroquel and this may represent her worsening hypokalemia.  Amount and/or Complexity of Data Reviewed Labs: ordered.  Risk Prescription drug management.     {Document critical care time when appropriate:1} {Document review of labs and clinical decision tools ie heart score, Chads2Vasc2 etc:1}  {Document your  independent review of radiology images, and any outside records:1} {Document your discussion with family members, caretakers, and with consultants:1} {Document social determinants of health affecting pt's care:1} {Document your decision making why or why not admission, treatments were needed:1} Final Clinical Impression(s) / ED Diagnoses Final diagnoses:  None    Rx / DC Orders ED Discharge Orders     None

## 2023-03-19 LAB — BASIC METABOLIC PANEL
Anion gap: 9 (ref 5–15)
BUN: 10 mg/dL (ref 6–20)
CO2: 30 mmol/L (ref 22–32)
Calcium: 8.8 mg/dL — ABNORMAL LOW (ref 8.9–10.3)
Chloride: 99 mmol/L (ref 98–111)
Creatinine, Ser: 0.74 mg/dL (ref 0.44–1.00)
GFR, Estimated: >60 mL/min (ref >60)
Glucose, Bld: 96 mg/dL (ref 70–99)
Potassium: 2.7 mmol/L — CL (ref 3.5–5.1)
Sodium: 138 mmol/L (ref 135–145)

## 2023-03-19 NOTE — ED Notes (Signed)
Date and time results received: 03/19/23 0143 (use smartphrase ".now" to insert current time)  Test: K+ Critical Value: 2.7  Name of Provider Notified: Carolynn Serve, MD

## 2023-03-19 NOTE — Discharge Instructions (Addendum)
Your potassium level this evening was 2.4.  You have been given IV and oral potassium here.  I recommend that you stop your lisinopril/hydrochlorothiazide and your Seroquel as these medications may be causing your low potassium level.  As you are PCP has recommended, take your potassium 40 equivalents 3 times a day until Monday and keep your appointment on Monday for recheck of your potassium level.

## 2023-03-19 NOTE — ED Notes (Signed)
ED Provider at bedside. 

## 2023-03-19 NOTE — ED Provider Notes (Signed)
12:33 AM Assumed care from [facility] and Dr. Jarold Motto, please see their note for full history, physical and decision making until this point. In brief this is a 57 y.o. year old female who presented to the ED tonight with No chief complaint on file.     Medication induced hypokalemia, pending recheck for same and if it is improving can be discharged, PCP follow up on Monday. ECG ok.   Potassium improved but still a bit low. Already ahs an appointment with pcp early next week and will increase her potassium from 20 daily to 40 TID. Prefers discharge. Will return if any new/worsening symptoms.   Discharge instructions, including strict return precautions for new or worsening symptoms, given. Patient and/or family verbalized understanding and agreement with the plan as described.   Labs, studies and imaging reviewed by myself and considered in medical decision making if ordered. Imaging interpreted by radiology.  Labs Reviewed  CBC - Abnormal; Notable for the following components:      Result Value   HCT 35.3 (*)    All other components within normal limits  BASIC METABOLIC PANEL - Abnormal; Notable for the following components:   Potassium 2.4 (*)    Glucose, Bld 138 (*)    All other components within normal limits  MAGNESIUM  BASIC METABOLIC PANEL  BASIC METABOLIC PANEL    No orders to display    No follow-ups on file.    Carolyn Allen, Barbara Cower, MD 03/19/23 [phone]

## 2023-03-29 ENCOUNTER — Emergency Department (HOSPITAL_COMMUNITY): Payer: Commercial Managed Care - HMO

## 2023-03-29 ENCOUNTER — Encounter (HOSPITAL_COMMUNITY): Payer: Self-pay | Admitting: Emergency Medicine

## 2023-03-29 ENCOUNTER — Other Ambulatory Visit: Payer: Self-pay

## 2023-03-29 ENCOUNTER — Emergency Department (HOSPITAL_COMMUNITY)
Admission: EM | Admit: 2023-03-29 | Discharge: 2023-03-29 | Disposition: A | Discharge status: Home / Self Care | Payer: Commercial Managed Care - HMO | Attending: Emergency Medicine | Admitting: Emergency Medicine

## 2023-03-29 DIAGNOSIS — R079 Chest pain, unspecified: Secondary | ICD-10-CM | POA: Diagnosis not present

## 2023-03-29 DIAGNOSIS — R531 Weakness: Secondary | ICD-10-CM | POA: Diagnosis present

## 2023-03-29 DIAGNOSIS — I10 Essential (primary) hypertension: Secondary | ICD-10-CM | POA: Diagnosis not present

## 2023-03-29 DIAGNOSIS — Z79899 Other long term (current) drug therapy: Secondary | ICD-10-CM | POA: Diagnosis not present

## 2023-03-29 DIAGNOSIS — N3000 Acute cystitis without hematuria: Secondary | ICD-10-CM | POA: Diagnosis not present

## 2023-03-29 LAB — URINALYSIS, ROUTINE W REFLEX MICROSCOPIC
Bilirubin Urine: NEGATIVE
Glucose, UA: NEGATIVE mg/dL
Hgb urine dipstick: NEGATIVE
Ketones, ur: NEGATIVE mg/dL
Nitrite: NEGATIVE
Protein, ur: NEGATIVE mg/dL
Specific Gravity, Urine: 1.013 (ref 1.005–1.030)
pH: 7 (ref 5.0–8.0)

## 2023-03-29 LAB — BASIC METABOLIC PANEL
Anion gap: 9 (ref 5–15)
BUN: 13 mg/dL (ref 6–20)
CO2: 28 mmol/L (ref 22–32)
Calcium: 9.6 mg/dL (ref 8.9–10.3)
Chloride: 99 mmol/L (ref 98–111)
Creatinine, Ser: 1.04 mg/dL — ABNORMAL HIGH (ref 0.44–1.00)
GFR, Estimated: >60 mL/min (ref >60)
Glucose, Bld: 118 mg/dL — ABNORMAL HIGH (ref 70–99)
Potassium: 3.6 mmol/L (ref 3.5–5.1)
Sodium: 136 mmol/L (ref 135–145)

## 2023-03-29 LAB — HEPATIC FUNCTION PANEL
ALT: 21 U/L (ref 0–44)
AST: 23 U/L (ref 15–41)
Albumin: 4.5 g/dL (ref 3.5–5.0)
Alkaline Phosphatase: 68 U/L (ref 38–126)
Bilirubin, Direct: 0.1 mg/dL (ref 0.0–0.2)
Indirect Bilirubin: 0.6 mg/dL (ref 0.3–0.9)
Total Bilirubin: 0.7 mg/dL (ref 0.3–1.2)
Total Protein: 7.2 g/dL (ref 6.5–8.1)

## 2023-03-29 LAB — CBC
HCT: 38.7 % (ref 36.0–46.0)
Hemoglobin: 12.9 g/dL (ref 12.0–15.0)
MCH: 30.9 pg (ref 26.0–34.0)
MCHC: 33.3 g/dL (ref 30.0–36.0)
MCV: 92.8 fL (ref 80.0–100.0)
Platelets: 268 10*3/uL (ref 150–400)
RBC: 4.17 MIL/uL (ref 3.87–5.11)
RDW: 13.2 % (ref 11.5–15.5)
WBC: 7.7 10*3/uL (ref 4.0–10.5)
nRBC: 0 % (ref 0.0–0.2)

## 2023-03-29 LAB — TROPONIN I (HIGH SENSITIVITY)
Troponin I (High Sensitivity): <2 ng/L (ref <18)
Troponin I (High Sensitivity): <2 ng/L (ref <18)

## 2023-03-29 LAB — LIPASE, BLOOD: Lipase: 28 U/L (ref 11–51)

## 2023-03-29 LAB — CBG MONITORING, ED: Glucose-Capillary: 115 mg/dL — ABNORMAL HIGH (ref 70–99)

## 2023-03-29 MED ORDER — SULFAMETHOXAZOLE-TRIMETHOPRIM 800-160 MG PO TABS: 1.0000 | ORAL_TABLET | Freq: Two times a day (BID) | ORAL | 0 refills | Status: AC

## 2023-03-29 NOTE — ED Provider Notes (Addendum)
[place] EMERGENCY DEPARTMENT AT [facility] Provider Note   CSN: [id number] Arrival date & time: 03/29/23  1658     History {Add pertinent medical, surgical, social history, OB history to HPI:1} Chief Complaint  Patient presents with   Weakness   Chest Pain    Carolyn Allen is a 57 y.o. female.  Patient has a history of hypertension.  She complains of feeling weak and having no energy.  Also she has been having some cramping all over including her chest   Weakness Associated symptoms: chest pain   Chest Pain Associated symptoms: weakness        Home Medications Prior to Admission medications   Medication Sig Start Date End Date Taking? Authorizing Provider  amLODipine (NORVASC) 2.5 MG tablet Take 10 mg by mouth daily.   Yes [provider]  Ascorbic Acid (VITAMIN C PO) Take 1 tablet by mouth daily.   Yes [provider]  Calcium Carbonate-Vitamin D (CALCIUM + D PO) Take 1 tablet by mouth daily.   Yes [provider]  clobetasol ointment (TEMOVATE) 0.05 % Apply 1 Application topically 2 (two) times daily. 07/05/22  Yes Particia Nearing, PA-C  cyclobenzaprine (FLEXERIL) 10 MG tablet Take 1 tablet (10 mg total) by mouth 3 (three) times daily as needed. Patient taking differently: Take 20 mg by mouth 2 (two) times daily. 08/01/18  Yes Joni Reining, PA-C  diclofenac Sodium (VOLTAREN) 1 % GEL Apply 2 g topically 4 (four) times daily. 08/03/21  Yes [provider]  dicyclomine (BENTYL) 20 MG tablet Take 1 tablet (20 mg total) by mouth every 6 (six) hours. Patient taking differently: Take 20 mg by mouth 3 (three) times daily before meals. 03/05/16  Yes Tonye Pearson, MD  DULoxetine (CYMBALTA) 20 MG capsule Take 20 mg by mouth every morning.    Yes [provider]  fluticasone (FLONASE) 50 MCG/ACT nasal spray    Yes [provider]  HYDROcodone-acetaminophen (NORCO/VICODIN) 5-325 MG tablet Take by  mouth. 11/20/21  Yes [provider]  levocetirizine (XYZAL) 5 MG tablet Take 1 tablet (5 mg total) by mouth every evening. 07/05/22  Yes Particia Nearing, PA-C  lidocaine (XYLOCAINE) 2 % solution Use as directed 15 mLs in the mouth or throat as needed for mouth pain. Dab on mouth ulcer as needed for mouth pain 02/14/23  Yes Cathlean Marseilles A, NP  lisinopril (ZESTRIL) 40 MG tablet Take 40 mg by mouth every morning.    Yes [provider]  meloxicam (MOBIC) 7.5 MG tablet Take 1 tablet (7.5 mg total) by mouth 2 (two) times daily. 03/05/16  Yes Tonye Pearson, MD  Multiple Vitamins-Minerals (MULTIVITAMIN WITH MINERALS) tablet Take 1 tablet by mouth daily.   Yes [provider]  omeprazole (PRILOSEC) 20 MG capsule Take 20 mg by mouth daily. 01/13/23  Yes [provider]  PARoxetine (PAXIL) 10 MG tablet Take 10 mg by mouth every morning. Take with 20 mg to equal 30 mg   Yes [provider]  PARoxetine (PAXIL) 20 MG tablet Take 20 mg by mouth See admin instructions. Take with 10 mg to equal 30 mg   Yes [provider]  polyethylene glycol (MIRALAX / GLYCOLAX) 17 g packet Take 17 g by mouth daily as needed.   Yes [provider]  potassium chloride SA (KLOR-CON M) 20 MEQ tablet Take 20 mEq by mouth daily. 02/24/23 04/25/23 Yes [provider]  promethazine (PHENERGAN) 25 MG  tablet Take 25 mg by mouth every 8 (eight) hours as needed.   Yes [provider]  QUEtiapine (SEROQUEL) 25 MG tablet Take 25 mg by mouth 2 (two) times daily.   Yes [provider]  sulfamethoxazole-trimethoprim (BACTRIM DS) 800-160 MG tablet Take 1 tablet by mouth 2 (two) times daily for 7 days. 03/29/23 04/05/23 Yes Bethann Berkshire, MD  SUMAtriptan (IMITREX) 50 MG tablet Take 100 mg by mouth every 2 (two) hours as needed for migraine. May repeat in 2 hours if headache persists or recurs.   Yes [provider]  traMADol (ULTRAM-ER) 200 MG  24 hr tablet Take 200 mg by mouth daily.   Yes [provider]  lisinopril-hydrochlorothiazide (ZESTORETIC) 20-12.5 MG tablet Take 2 tablets by mouth daily. Patient not taking: Reported on 03/29/2023 03/11/22   [provider]      Allergies    Pholcodine, Codeine, Methocarbamol, Oxycodone, and Tizanidine    Review of Systems   Review of Systems  Cardiovascular:  Positive for chest pain.  Neurological:  Positive for weakness.    Physical Exam Updated Vital Signs BP 134/65 (BP Location: Right Arm)   Pulse 70   Temp 98.2 F (36.8 C) (Oral)   Resp 13   Wt 95 kg   SpO2 97%   BMI 32.32 kg/m  Physical Exam  ED Results / Procedures / Treatments   Labs (all labs ordered are listed, but only abnormal results are displayed) Labs Reviewed  BASIC METABOLIC PANEL - Abnormal; Notable for the following components:      Result Value   Glucose, Bld 118 (*)    Creatinine, Ser 1.04 (*)    All other components within normal limits  URINALYSIS, ROUTINE W REFLEX MICROSCOPIC - Abnormal; Notable for the following components:   Leukocytes,Ua MODERATE (*)    Bacteria, UA RARE (*)    All other components within normal limits  CBG MONITORING, ED - Abnormal; Notable for the following components:   Glucose-Capillary 115 (*)    All other components within normal limits  URINE CULTURE  CBC  HEPATIC FUNCTION PANEL  LIPASE, BLOOD  TROPONIN I (HIGH SENSITIVITY)  TROPONIN I (HIGH SENSITIVITY)    EKG EKG Interpretation  Date/Time:  Tuesday March 29 2023 17:11:45 EDT Ventricular Rate:  86 PR Interval:  180 QRS Duration: 74 QT Interval:  374 QTC Calculation: 447 R Axis:   70 Text Interpretation: Normal sinus rhythm Low voltage QRS Cannot rule out Anterior infarct , age undetermined Abnormal ECG When compared with ECG of 18-Mar-2023 19:19, PREVIOUS ECG IS PRESENT Confirmed by Bethann Berkshire [phone]) on 03/29/2023 8:27:19 PM  Radiology DG Chest 2 View  Result Date:  03/29/2023 CLINICAL DATA:  Chest pressure EXAM: CHEST - 2 VIEW COMPARISON:  X-ray 12/17/2016.  CT 08/01/2018 FINDINGS: Neurostimulator leads along the spine. No consolidation, pneumothorax or effusion. No edema. Normal cardiopericardial silhouette. IMPRESSION: No acute cardiopulmonary disease.  Neurostimulator. Electronically Signed   By: Karen Kays M.D.   On: 03/29/2023 17:47    Procedures Procedures  {Document cardiac monitor, telemetry assessment procedure when appropriate:1}  Medications Ordered in ED Medications - No data to display  ED Course/ Medical Decision Making/ A&P   {   Click here for ABCD2, HEART and other calculatorsREFRESH Note before signing :1}                          Medical Decision Making Amount and/or Complexity of Data Reviewed Labs: ordered.  Radiology: ordered.  Risk Prescription drug management.   Patient with a urinary tract infection.  She will be started on Bactrim and follow-up with PCP  {Document critical care time when appropriate:1} {Document review of labs and clinical decision tools ie heart score, Chads2Vasc2 etc:1}  {Document your independent review of radiology images, and any outside records:1} {Document your discussion with family members, caretakers, and with consultants:1} {Document social determinants of health affecting pt's care:1} {Document your decision making why or why not admission, treatments were needed:1} Final Clinical Impression(s) / ED Diagnoses Final diagnoses:  Acute cystitis without hematuria    Rx / DC Orders ED Discharge Orders          Ordered    sulfamethoxazole-trimethoprim (BACTRIM DS) 800-160 MG tablet  2 times daily        03/29/23 2249

## 2023-03-29 NOTE — Discharge Instructions (Signed)
Drink plenty of fluids and follow-up with your doctor after you finish the antibiotics

## 2023-03-29 NOTE — ED Triage Notes (Signed)
Pt states feels like potassium is dropping. For past 3 days has felt weak, and cramping and chest pressure.

## 2023-03-31 LAB — URINE CULTURE: Culture: NO GROWTH

## 2023-04-24 ENCOUNTER — Encounter (HOSPITAL_COMMUNITY): Payer: Self-pay | Admitting: Emergency Medicine

## 2023-04-24 ENCOUNTER — Emergency Department (HOSPITAL_COMMUNITY): Payer: Commercial Managed Care - HMO

## 2023-04-24 ENCOUNTER — Other Ambulatory Visit: Payer: Self-pay

## 2023-04-24 ENCOUNTER — Emergency Department (HOSPITAL_COMMUNITY)
Admission: EM | Admit: 2023-04-24 | Discharge: 2023-04-24 | Disposition: A | Discharge status: Home / Self Care | Payer: Commercial Managed Care - HMO | Attending: Emergency Medicine | Admitting: Emergency Medicine

## 2023-04-24 DIAGNOSIS — E876 Hypokalemia: Secondary | ICD-10-CM | POA: Diagnosis not present

## 2023-04-24 DIAGNOSIS — R079 Chest pain, unspecified: Secondary | ICD-10-CM | POA: Insufficient documentation

## 2023-04-24 DIAGNOSIS — I10 Essential (primary) hypertension: Secondary | ICD-10-CM | POA: Diagnosis not present

## 2023-04-24 DIAGNOSIS — R790 Abnormal level of blood mineral: Secondary | ICD-10-CM | POA: Diagnosis present

## 2023-04-24 LAB — COMPREHENSIVE METABOLIC PANEL
ALT: 26 U/L (ref 0–44)
AST: 25 U/L (ref 15–41)
Albumin: 4.6 g/dL (ref 3.5–5.0)
Alkaline Phosphatase: 74 U/L (ref 38–126)
Anion gap: 11 (ref 5–15)
BUN: 12 mg/dL (ref 6–20)
CO2: 28 mmol/L (ref 22–32)
Calcium: 9.5 mg/dL (ref 8.9–10.3)
Chloride: 98 mmol/L (ref 98–111)
Creatinine, Ser: 0.7 mg/dL (ref 0.44–1.00)
GFR, Estimated: >60 mL/min (ref >60)
Glucose, Bld: 109 mg/dL — ABNORMAL HIGH (ref 70–99)
Potassium: 2.7 mmol/L — CL (ref 3.5–5.1)
Sodium: 137 mmol/L (ref 135–145)
Total Bilirubin: 0.4 mg/dL (ref 0.3–1.2)
Total Protein: 7.6 g/dL (ref 6.5–8.1)

## 2023-04-24 LAB — CBC
HCT: 38.5 % (ref 36.0–46.0)
Hemoglobin: 13.3 g/dL (ref 12.0–15.0)
MCH: 31.1 pg (ref 26.0–34.0)
MCHC: 34.5 g/dL (ref 30.0–36.0)
MCV: 90 fL (ref 80.0–100.0)
Platelets: 280 10*3/uL (ref 150–400)
RBC: 4.28 MIL/uL (ref 3.87–5.11)
RDW: 13.1 % (ref 11.5–15.5)
WBC: 6.5 10*3/uL (ref 4.0–10.5)
nRBC: 0 % (ref 0.0–0.2)

## 2023-04-24 LAB — MAGNESIUM: Magnesium: 2 mg/dL (ref 1.7–2.4)

## 2023-04-24 LAB — TROPONIN I (HIGH SENSITIVITY)
Troponin I (High Sensitivity): 3 ng/L (ref <18)
Troponin I (High Sensitivity): 3 ng/L (ref <18)

## 2023-04-24 MED ORDER — POTASSIUM CHLORIDE CRYS ER 20 MEQ PO TBCR
40.0000 meq | EXTENDED_RELEASE_TABLET | Freq: Once | ORAL | Status: AC
  Administered 2023-04-24: 40 meq via ORAL
  Filled 2023-04-24: qty 2

## 2023-04-24 MED ORDER — POTASSIUM CHLORIDE 10 MEQ/100ML IV SOLN
10.0000 meq | INTRAVENOUS | Status: AC
  Administered 2023-04-24 (×3): 10 meq via INTRAVENOUS
  Filled 2023-04-24 (×3): qty 100

## 2023-04-24 NOTE — ED Provider Notes (Signed)
[place] EMERGENCY DEPARTMENT AT [facility] Provider Note   CSN: [id number] Arrival date & time: 04/24/23  1429     History  Chief Complaint  Patient presents with   abnormal labs    Carolyn Allen is a 57 y.o. female.  Ports history of chronic pain syndrome, hypertension and hypokalemia.  States she has had low potassium several times and is taking potassium daily.  She had labs drawn by her PCP yesterday and the call today that her potassium was low and to come to the ER for further evaluation.  Reports she is having chest pain for the past month that is constant and dull.  Also having intermittent extremity swelling though has no swelling today.  No shortness of breath, no fevers or chills, no nausea vomiting or diarrhea.  No muscle cramping or other symptoms.    HPI     Home Medications Prior to Admission medications   Medication Sig Start Date End Date Taking? Authorizing Provider  amLODipine (NORVASC) 2.5 MG tablet Take 10 mg by mouth daily.    [provider]  Ascorbic Acid (VITAMIN C PO) Take 1 tablet by mouth daily.    [provider]  Calcium Carbonate-Vitamin D (CALCIUM + D PO) Take 1 tablet by mouth daily.    [provider]  clobetasol ointment (TEMOVATE) 0.05 % Apply 1 Application topically 2 (two) times daily. 07/05/22   Particia Nearing, PA-C  cyclobenzaprine (FLEXERIL) 10 MG tablet Take 1 tablet (10 mg total) by mouth 3 (three) times daily as needed. Patient taking differently: Take 20 mg by mouth 2 (two) times daily. 08/01/18   Joni Reining, PA-C  diclofenac Sodium (VOLTAREN) 1 % GEL Apply 2 g topically 4 (four) times daily. 08/03/21   [provider]  dicyclomine (BENTYL) 20 MG tablet Take 1 tablet (20 mg total) by mouth every 6 (six) hours. Patient taking differently: Take 20 mg by mouth 3 (three) times daily before meals. 03/05/16   Tonye Pearson, MD  DULoxetine (CYMBALTA) 20 MG capsule Take 20  mg by mouth every morning.     [provider]  fluticasone (FLONASE) 50 MCG/ACT nasal spray     [provider]  HYDROcodone-acetaminophen (NORCO/VICODIN) 5-325 MG tablet Take by mouth. 11/20/21   [provider]  levocetirizine (XYZAL) 5 MG tablet Take 1 tablet (5 mg total) by mouth every evening. 07/05/22   Particia Nearing, PA-C  lidocaine (XYLOCAINE) 2 % solution Use as directed 15 mLs in the mouth or throat as needed for mouth pain. Dab on mouth ulcer as needed for mouth pain 02/14/23   Cathlean Marseilles A, NP  lisinopril (ZESTRIL) 40 MG tablet Take 40 mg by mouth every morning.     [provider]  lisinopril-hydrochlorothiazide (ZESTORETIC) 20-12.5 MG tablet Take 2 tablets by mouth daily. Patient not taking: Reported on 03/29/2023 03/11/22   [provider]  meloxicam (MOBIC) 7.5 MG tablet Take 1 tablet (7.5 mg total) by mouth 2 (two) times daily. 03/05/16   Tonye Pearson, MD  Multiple Vitamins-Minerals (MULTIVITAMIN WITH MINERALS) tablet Take 1 tablet by mouth daily.    [provider]  omeprazole (PRILOSEC) 20 MG capsule Take 20 mg by mouth daily. 01/13/23   [provider]  PARoxetine (PAXIL) 10 MG tablet Take 10 mg by mouth every morning. Take with 20 mg to equal 30 mg    [provider]  PARoxetine (PAXIL) 20 MG tablet Take 20 mg  by mouth See admin instructions. Take with 10 mg to equal 30 mg    [provider]  polyethylene glycol (MIRALAX / GLYCOLAX) 17 g packet Take 17 g by mouth daily as needed.    [provider]  potassium chloride SA (KLOR-CON M) 20 MEQ tablet Take 20 mEq by mouth daily. 02/24/23 04/25/23  [provider]  promethazine (PHENERGAN) 25 MG tablet Take 25 mg by mouth every 8 (eight) hours as needed.    [provider]  QUEtiapine (SEROQUEL) 25 MG tablet Take 25 mg by mouth 2 (two) times daily.    [provider]  SUMAtriptan (IMITREX) 50 MG tablet  Take 100 mg by mouth every 2 (two) hours as needed for migraine. May repeat in 2 hours if headache persists or recurs.    [provider]  traMADol (ULTRAM-ER) 200 MG 24 hr tablet Take 200 mg by mouth daily.    [provider]      Allergies    Pholcodine, Codeine, Methocarbamol, Oxycodone, and Tizanidine    Review of Systems   Review of Systems  Physical Exam Updated Vital Signs BP 129/74   Pulse 74   Temp 98.6 F (37 C) (Oral)   Resp 18   Ht 5' 7.5" (1.715 m)   Wt 95 kg   SpO2 96%   BMI 32.33 kg/m  Physical Exam Vitals and nursing note reviewed.  Constitutional:      General: She is not in acute distress.    Appearance: She is well-developed.  HENT:     Head: Normocephalic and atraumatic.     Mouth/Throat:     Mouth: Mucous membranes are moist.  Eyes:     Conjunctiva/sclera: Conjunctivae normal.  Cardiovascular:     Rate and Rhythm: Normal rate and regular rhythm.     Heart sounds: No murmur heard. Pulmonary:     Effort: Pulmonary effort is normal. No respiratory distress.     Breath sounds: Normal breath sounds.  Abdominal:     Palpations: Abdomen is soft.     Tenderness: There is no abdominal tenderness.  Musculoskeletal:        General: No swelling.     Cervical back: Neck supple.  Skin:    General: Skin is warm and dry.     Capillary Refill: Capillary refill takes less than 2 seconds.  Neurological:     General: No focal deficit present.     Mental Status: She is alert and oriented to person, place, and time.  Psychiatric:        Mood and Affect: Mood normal.     ED Results / Procedures / Treatments   Labs (all labs ordered are listed, but only abnormal results are displayed) Labs Reviewed  COMPREHENSIVE METABOLIC PANEL - Abnormal; Notable for the following components:      Result Value   Potassium 2.7 (*)    Glucose, Bld 109 (*)    All other components within normal limits  CBC  MAGNESIUM  TROPONIN I (HIGH SENSITIVITY)   TROPONIN I (HIGH SENSITIVITY)    EKG None  Radiology DG Chest Portable 1 View  Result Date: 04/24/2023 CLINICAL DATA:  Chest pain. EXAM: PORTABLE CHEST 1 VIEW COMPARISON:  Chest radiograph 03/29/2023 FINDINGS: The patient is rotated limiting evaluation. The heart size and mediastinal contours appear within normal limits. The lungs are clear. No pneumothorax or significant pleural effusion. No acute finding in the visualized skeleton. IMPRESSION: No acute cardiopulmonary process. Electronically Signed  By: Emmaline Kluver M.D.   On: 04/24/2023 16:12    Procedures Procedures    Medications Ordered in ED Medications  potassium chloride SA (KLOR-CON M) CR tablet 40 mEq (40 mEq Oral Given 04/24/23 1538)  potassium chloride 10 mEq in 100 mL IVPB (0 mEq Intravenous Stopped 04/24/23 1910)    ED Course/ Medical Decision Making/ A&P                             Medical Decision Making This patient presents to the ED for concern of low potassium, fatigue, chest pain, this involves an extensive number of treatment options, and is a complaint that carries with it a high risk of complications and morbidity.  The differential diagnosis includes electrolyte abnormality, ACS, pneumonia, pericarditis, pulmonary embolism, other   Co morbidities that complicate the patient evaluation  HTN, chronic pain   Additional history obtained:  Additional history obtained from EMR External records from outside source obtained and reviewed including PCP note from today   Lab Tests:  I Ordered, and personally interpreted labs.  The pertinent results include: Delta 0, potassium is 2.7, magnesium is normal, CBC is normal   Imaging Studies ordered:  I ordered imaging studies including chest x-ray I independently visualized and interpreted imaging which showed no acute cardiopulmonary disease I agree with the radiologist interpretation   Cardiac Monitoring: / EKG:  The patient was maintained on a  cardiac monitor.  I personally viewed and interpreted the cardiac monitored which showed an underlying rhythm of: Sinus rhythm  EG shows sinus rhythm no ischemic changes   Problem List / ED Course / Critical interventions / Medication management  Is here for low potassium.  She is complaining of chest pain has been going on for a month and intermittent extremity swelling.  Her initial troponin is 3, delta is pending but the rest of her labs are reassuring aside from her hypokalemia.  Normal magnesium.  And repleted her potassium here.  Patient states of a lot of anxiety and is very worried about her chest pain.  She feels like nobody is taking it seriously and is requesting referral to cardiology which I will provide her with.  Her symptoms have been constant are not typical of angina.  Does have risk factor of obesity and hypertension.  Taking potassium at home, continue eating foods high in potassium and call PCP tomorrow for close recheck and follow-up. I have reviewed the patients home medicines and have made adjustments as needed        Amount and/or Complexity of Data Reviewed Labs: ordered. Radiology: ordered.  Risk Prescription drug management.           Final Clinical Impression(s) / ED Diagnoses Final diagnoses:  Hypokalemia  Chest pain, unspecified type    Rx / DC Orders ED Discharge Orders          Ordered    Ambulatory referral to Cardiology       Comments: If you have not heard from the Cardiology office within the next 72 hours please call [phone].   04/24/23 1813              Josem Kaufmann 04/24/23 Andy Gauss, MD 04/24/23 2325

## 2023-04-24 NOTE — Discharge Instructions (Addendum)
You are seen today for your low potassium.  We gave you some potassium by mouth to help increase your potassium.  Is important to continue taking your potassium at home. Take double your usual (a total of 40 mg) for the next 2-3 days. Call your PCP tomorrow to arrange recheck and follow up As requested with your chest pain that has been ongoing I have put in referral for cardiology follow-up.  Your EKG chest x-ray and troponin tests are reassuring

## 2023-04-24 NOTE — ED Triage Notes (Signed)
Pt via POV after MD called to inform her that her K+ is 2.9 and that she should come to ER for repletion. Pt reports chest discomfort, lightheadedness, and headaches. No n/v/d

## 2023-06-01 ENCOUNTER — Ambulatory Visit: Payer: Commercial Managed Care - HMO | Attending: Internal Medicine | Admitting: Internal Medicine

## 2023-06-01 ENCOUNTER — Encounter: Payer: Self-pay | Admitting: Internal Medicine

## 2023-06-01 VITALS — BP 132/84 | HR 83 | Ht 67.5 in | Wt 209.8 lb

## 2023-06-01 DIAGNOSIS — R079 Chest pain, unspecified: Secondary | ICD-10-CM | POA: Diagnosis not present

## 2023-06-01 NOTE — Progress Notes (Signed)
Cardiology Office Note:    Date:  06/01/2023   ID:  Carolyn Allen, DOB 30-Jul-1966, MRN [id number]  PCP:  [facility]   [facility] Providers Cardiologist:  None     Referring MD: [facility], [facility]*   No chief complaint on file. CP  History of Present Illness:    Carolyn Allen is a 57 y.o. female with a hx of HTN, OSA, documented chronic CP. She was seen in the ED in May 2024 for 1 month of CP. She is low risk and had a normal w/u. She was concerned that her symptoms were ignored and referral was sent to cardiology.  HPI is challenging. Tangential. She notes walking and she feels winded. She is having panic attacks. She reports that she is planned for a spinal stimulator battery exchange and cardiology was asked to provide pre-op recommendations.   Echo 05/02/2023- normal LV function. Normal RV fxn. No valve dx.  Exercise 09/08/2021- stress echo normal  Past Medical History:  Diagnosis Date   Allergy    Arthritis    Back pain    Depression    GERD (gastroesophageal reflux disease)    no meds   Heart murmur    HSV (herpes simplex virus) infection    Hypertension    Migraines    migraines   Mitral valve prolapse    does not see cardiologist   PONV (postoperative nausea and vomiting)    Sleep apnea    has not used cpap machine lately   TMJ (temporomandibular joint syndrome)     Past Surgical History:  Procedure Laterality Date   ABDOMINAL HYSTERECTOMY     CYSTECTOMY     ovaries   JOINT REPLACEMENT     both knees   KNEE ARTHROSCOPY Left 11/21/2019   Procedure: ARTHROSCOPY KNEE WITH DEBRIDEMENT OF PAINFUL SCAR TISSUE STATUS POST A PRIOR TOTAL KNEE ARTHROPLASTY;  Surgeon: Christena Flake, MD;  Location: [facility] ORS;  Service: Orthopedics;  Laterality: Left;   SHOULDER SURGERY Right    SPINAL CORD STIMULATOR INSERTION N/A 10/08/2013   Procedure: LUMBAR SPINAL CORD STIMULATOR INSERTION;  Surgeon: Venita Lick, MD;  Location: [facility] OR;   Service: Orthopedics;  Laterality: N/A;   SPINE SURGERY     TMJ ARTHROPLASTY      Current Medications: Current Meds  Medication Sig   amLODipine (NORVASC) 2.5 MG tablet Take 10 mg by mouth daily.   Ascorbic Acid (VITAMIN C PO) Take 1 tablet by mouth daily.   Calcium Carbonate-Vitamin D (CALCIUM + D PO) Take 1 tablet by mouth daily.   clobetasol ointment (TEMOVATE) 0.05 % Apply 1 Application topically 2 (two) times daily.   cyclobenzaprine (FLEXERIL) 10 MG tablet Take 1 tablet (10 mg total) by mouth 3 (three) times daily as needed. (Patient taking differently: Take 20 mg by mouth 2 (two) times daily.)   diclofenac Sodium (VOLTAREN) 1 % GEL Apply 2 g topically 4 (four) times daily.   dicyclomine (BENTYL) 20 MG tablet Take 1 tablet (20 mg total) by mouth every 6 (six) hours. (Patient taking differently: Take 20 mg by mouth 3 (three) times daily before meals.)   DULoxetine (CYMBALTA) 20 MG capsule Take 20 mg by mouth every morning.    fluticasone (FLONASE) 50 MCG/ACT nasal spray    HYDROcodone-acetaminophen (NORCO/VICODIN) 5-325 MG tablet Take by mouth.   levocetirizine (XYZAL) 5 MG tablet Take 1 tablet (5 mg total) by mouth every evening.   lidocaine (XYLOCAINE) 2 % solution  Use as directed 15 mLs in the mouth or throat as needed for mouth pain. Dab on mouth ulcer as needed for mouth pain   lisinopril (ZESTRIL) 40 MG tablet Take 40 mg by mouth every morning.    lisinopril-hydrochlorothiazide (ZESTORETIC) 20-12.5 MG tablet Take 2 tablets by mouth daily.   meloxicam (MOBIC) 7.5 MG tablet Take 1 tablet (7.5 mg total) by mouth 2 (two) times daily.   Multiple Vitamins-Minerals (MULTIVITAMIN WITH MINERALS) tablet Take 1 tablet by mouth daily.   omeprazole (PRILOSEC) 20 MG capsule Take 20 mg by mouth daily.   PARoxetine (PAXIL) 10 MG tablet Take 10 mg by mouth every morning. Take with 20 mg to equal 30 mg   PARoxetine (PAXIL) 20 MG tablet Take 20 mg by mouth See admin instructions. Take with 10 mg to  equal 30 mg   polyethylene glycol (MIRALAX / GLYCOLAX) 17 g packet Take 17 g by mouth daily as needed.   promethazine (PHENERGAN) 25 MG tablet Take 25 mg by mouth every 8 (eight) hours as needed.   QUEtiapine (SEROQUEL) 25 MG tablet Take 25 mg by mouth 2 (two) times daily.   SUMAtriptan (IMITREX) 50 MG tablet Take 100 mg by mouth every 2 (two) hours as needed for migraine. May repeat in 2 hours if headache persists or recurs.   traMADol (ULTRAM-ER) 200 MG 24 hr tablet Take 200 mg by mouth daily.     Allergies:   Pholcodine, Codeine, Methocarbamol, Oxycodone, and Tizanidine   Social History   Socioeconomic History   Marital status: Single    Spouse name: Not on file   Number of children: Not on file   Years of education: Not on file   Highest education level: Not on file  Occupational History   Not on file  Tobacco Use   Smoking status: Never   Smokeless tobacco: Never  Vaping Use   Vaping Use: Never used  Substance and Sexual Activity   Alcohol use: No   Drug use: No   Sexual activity: Yes    Birth control/protection: Surgical  Other Topics Concern   Not on file  Social History Narrative   Not on file   Social Determinants of Health   Financial Resource Strain: Not on file  Food Insecurity: Not on file  Transportation Needs: Not on file  Physical Activity: Not on file  Stress: Not on file  Social Connections: Not on file     Family History: The patient's family history includes Cancer in her father and mother; Diabetes in her sister; Fibromyalgia in her sister; Heart disease in her father and sister; Hypertension in her sister; Mental illness in her sister.  ROS:   Please see the history of present illness.     All other systems reviewed and are negative.  EKGs/Labs/Other Studies Reviewed:    The following studies were reviewed today:   06/01/2023- NSR      Recent Labs: 04/24/2023: ALT 26; BUN 12; Creatinine, Ser 0.70; Hemoglobin 13.3; Magnesium 2.0;  Platelets 280; Potassium 2.7; Sodium 137  Recent Lipid Panel    Component Value Date/Time   CHOL 159 03/05/2016 1533   TRIG 198 (H) 03/05/2016 1533   HDL 57 03/05/2016 1533   CHOLHDL 2.8 03/05/2016 1533   VLDL 40 (H) 03/05/2016 1533   LDLCALC 62 03/05/2016 1533     Risk Assessment/Calculations:     Physical Exam:    VS:   Vitals:   06/01/23 1457  BP: 132/84  Pulse: 83  SpO2: 99%    Wt Readings from Last 3 Encounters:  04/24/23 209 lb 8 oz (95 kg)  03/29/23 209 lb 7 oz (95 kg)  03/18/23 210 lb (95.3 kg)     GEN:  Well nourished, well developed in no acute distress HEENT: Normal NECK: No JVD; No carotid bruits LYMPHATICS: No lymphadenopathy CARDIAC: RRR, no murmurs, rubs, gallops RESPIRATORY:  Clear to auscultation without rales, wheezing or rhonchi  ABDOMEN: Soft, non-tender, non-distended MUSCULOSKELETAL:  No edema; No deformity  SKIN: Warm and dry NEUROLOGIC:  Alert and oriented x 3 PSYCHIATRIC:  Normal affect   ASSESSMENT:    PreOP She can walk > 4 METS without significant shortness of breath or chest pressure. She has chronic mild CP, do not think this is cardiac in nature. Her w/u was negative in the ED , recent echo in may showed normal fxn/ no WMA, and echo stress in 2022 was normal. She is overall low risk for CAD. She is low risk for intermediate risk surgery.  PLAN:    In order of problems listed above:   She is acceptable risk for spinal chord stimulator batter replacement. No further w/u           Medication Adjustments/Labs and Tests Ordered: Current medicines are reviewed at length with the patient today.  Concerns regarding medicines are outlined above.  Orders Placed This Encounter  Procedures   EKG 12-Lead   No orders of the defined types were placed in this encounter.   There are no Patient Instructions on file for this visit.   Signed, Maisie Fus, MD  06/01/2023 2:59 PM    [facility]

## 2023-06-01 NOTE — Patient Instructions (Signed)
Medication Instructions:  No changes  *If you need a refill on your cardiac medications before your next appointment, please call your pharmacy*   Lab Work: None ordered  If you have labs (blood work) drawn today and your tests are completely normal, you will receive your results only by: MyChart Message (if you have MyChart) OR A paper copy in the mail If you have any lab test that is abnormal or we need to change your treatment, we will call you to review the results.   Testing/Procedures: None ordered   Follow-Up: At [facility], you and your health needs are our priority.  As part of our continuing mission to provide you with exceptional heart care, we have created designated Provider Care Teams.  These Care Teams include your primary Cardiologist (physician) and Advanced Practice Providers (APPs -  Physician Assistants and Nurse Practitioners) who all work together to provide you with the care you need, when you need it.  We recommend signing up for the patient portal called "MyChart".  Sign up information is provided on this After Visit Summary.  MyChart is used to connect with patients for Virtual Visits (Telemedicine).  Patients are able to view lab/test results, encounter notes, upcoming appointments, etc.  Non-urgent messages can be sent to your provider as well.   To learn more about what you can do with MyChart, go to [email or web address].    Your next appointment:    As needed  Provider:

## 2023-06-08 ENCOUNTER — Telehealth: Payer: Self-pay | Admitting: *Deleted

## 2023-06-08 NOTE — Telephone Encounter (Signed)
   Patient Name: Carolyn Allen  DOB: 1966-08-01 MRN: [id number]  Primary Cardiologist: None  Chart reviewed as part of pre-operative protocol coverage. Given past medical history and time since last visit, based on ACC/AHA guidelines, Carolyn Allen is at acceptable risk for the planned procedure without further cardiovascular testing. Please see enclosed office visit not from 06/01/2023.  The patient was advised that if she develops new symptoms prior to surgery to contact our office to arrange for a follow-up visit, and she verbalized understanding.  I will route this recommendation to the requesting party via Epic fax function and remove from pre-op pool.  Please call with questions.  Napoleon Form, Leodis Rains, NP 06/08/2023, 12:55 PM

## 2023-06-08 NOTE — Telephone Encounter (Signed)
   Pre-operative Risk Assessment    Patient Name: Carolyn Allen  DOB: 10-03-1966 MRN: [id number]      Request for Surgical Clearance    Procedure:   SPINAL CORD STIMULATOR BATTERY EXCHANGE  Date of Surgery:  Clearance 06/30/23                                 Surgeon:  DR. [facility]OOKS Surgeon's Group or Practice Name:  Carolyn Allen Phone number:  [phone] ATTN: Carolyn Allen Fax number:  [phone]   Type of Clearance Requested:   - Medical ; NO MEDICATIONS LISTED AS NEEDING TO BE HELD   Type of Anesthesia:  General    Additional requests/questions:    Carolyn Allen   06/08/2023, 12:51 PM

## 2023-10-11 ENCOUNTER — Emergency Department (HOSPITAL_COMMUNITY)
Admission: EM | Admit: 2023-10-11 | Discharge: 2023-10-12 | Disposition: A | Discharge status: Home / Self Care | Payer: Commercial Managed Care - HMO | Attending: Emergency Medicine | Admitting: Emergency Medicine

## 2023-10-11 ENCOUNTER — Other Ambulatory Visit: Payer: Self-pay

## 2023-10-11 ENCOUNTER — Encounter (HOSPITAL_COMMUNITY): Payer: Self-pay

## 2023-10-11 DIAGNOSIS — Z79899 Other long term (current) drug therapy: Secondary | ICD-10-CM | POA: Insufficient documentation

## 2023-10-11 DIAGNOSIS — I10 Essential (primary) hypertension: Secondary | ICD-10-CM | POA: Insufficient documentation

## 2023-10-11 DIAGNOSIS — R6883 Chills (without fever): Secondary | ICD-10-CM | POA: Insufficient documentation

## 2023-10-11 DIAGNOSIS — R519 Headache, unspecified: Secondary | ICD-10-CM | POA: Diagnosis not present

## 2023-10-11 DIAGNOSIS — G8929 Other chronic pain: Secondary | ICD-10-CM | POA: Insufficient documentation

## 2023-10-11 DIAGNOSIS — E876 Hypokalemia: Secondary | ICD-10-CM | POA: Insufficient documentation

## 2023-10-11 DIAGNOSIS — M791 Myalgia, unspecified site: Secondary | ICD-10-CM | POA: Diagnosis not present

## 2023-10-11 DIAGNOSIS — R7309 Other abnormal glucose: Secondary | ICD-10-CM | POA: Insufficient documentation

## 2023-10-11 DIAGNOSIS — D72829 Elevated white blood cell count, unspecified: Secondary | ICD-10-CM | POA: Diagnosis not present

## 2023-10-11 DIAGNOSIS — R799 Abnormal finding of blood chemistry, unspecified: Secondary | ICD-10-CM | POA: Diagnosis present

## 2023-10-11 DIAGNOSIS — R739 Hyperglycemia, unspecified: Secondary | ICD-10-CM

## 2023-10-11 LAB — CBC WITH DIFFERENTIAL/PLATELET
Abs Immature Granulocytes: 0.05 10*3/uL (ref 0.00–0.07)
Basophils Absolute: 0 10*3/uL (ref 0.0–0.1)
Basophils Relative: 0 %
Eosinophils Absolute: 0.1 10*3/uL (ref 0.0–0.5)
Eosinophils Relative: 1 %
HCT: 39.9 % (ref 36.0–46.0)
Hemoglobin: 14.1 g/dL (ref 12.0–15.0)
Immature Granulocytes: 0 %
Lymphocytes Relative: 15 %
Lymphs Abs: 1.8 10*3/uL (ref 0.7–4.0)
MCH: 30.8 pg (ref 26.0–34.0)
MCHC: 35.3 g/dL (ref 30.0–36.0)
MCV: 87.1 fL (ref 80.0–100.0)
Monocytes Absolute: 0.7 10*3/uL (ref 0.1–1.0)
Monocytes Relative: 6 %
Neutro Abs: 9.1 10*3/uL — ABNORMAL HIGH (ref 1.7–7.7)
Neutrophils Relative %: 78 %
Platelets: 273 10*3/uL (ref 150–400)
RBC: 4.58 MIL/uL (ref 3.87–5.11)
RDW: 13.4 % (ref 11.5–15.5)
WBC: 11.9 10*3/uL — ABNORMAL HIGH (ref 4.0–10.5)
nRBC: 0 % (ref 0.0–0.2)

## 2023-10-11 LAB — COMPREHENSIVE METABOLIC PANEL
ALT: 19 U/L (ref 0–44)
AST: 23 U/L (ref 15–41)
Albumin: 4.9 g/dL (ref 3.5–5.0)
Alkaline Phosphatase: 91 U/L (ref 38–126)
Anion gap: 12 (ref 5–15)
BUN: 9 mg/dL (ref 6–20)
CO2: 31 mmol/L (ref 22–32)
Calcium: 9.6 mg/dL (ref 8.9–10.3)
Chloride: 94 mmol/L — ABNORMAL LOW (ref 98–111)
Creatinine, Ser: 0.9 mg/dL (ref 0.44–1.00)
GFR, Estimated: >60 mL/min (ref >60)
Glucose, Bld: 129 mg/dL — ABNORMAL HIGH (ref 70–99)
Potassium: 2.3 mmol/L — CL (ref 3.5–5.1)
Sodium: 137 mmol/L (ref 135–145)
Total Bilirubin: 0.5 mg/dL (ref 0.3–1.2)
Total Protein: 7.9 g/dL (ref 6.5–8.1)

## 2023-10-11 LAB — MAGNESIUM: Magnesium: 1.9 mg/dL (ref 1.7–2.4)

## 2023-10-11 MED ORDER — ACETAMINOPHEN 325 MG PO TABS
650.0000 mg | ORAL_TABLET | Freq: Once | ORAL | Status: AC
  Administered 2023-10-11: 650 mg via ORAL
  Filled 2023-10-11: qty 2

## 2023-10-11 MED ORDER — POTASSIUM CHLORIDE 10 MEQ/100ML IV SOLN
10.0000 meq | INTRAVENOUS | Status: AC
  Administered 2023-10-11 – 2023-10-12 (×3): 10 meq via INTRAVENOUS
  Filled 2023-10-11 (×3): qty 100

## 2023-10-11 MED ORDER — POTASSIUM CHLORIDE CRYS ER 20 MEQ PO TBCR
40.0000 meq | EXTENDED_RELEASE_TABLET | Freq: Once | ORAL | Status: AC
  Administered 2023-10-11: 40 meq via ORAL
  Filled 2023-10-11: qty 2

## 2023-10-11 NOTE — ED Provider Notes (Signed)
[place] EMERGENCY DEPARTMENT AT [facility] Provider Note   CSN: [id number] Arrival date & time: 10/11/23  1911     History {Add pertinent medical, surgical, social history, OB history to HPI:1} Chief Complaint  Patient presents with   Abnormal Lab    Carolyn Allen is a 57 y.o. female.  The history is provided by the patient.  Abnormal Lab She has history of hypertension, hyperlipidemia, hypokalemia, chronic pain and had a routine blood draw which showed potassium of 2.2.  Her primary care provider called her and told her to come into the emergency department.  She states that she has been having problems with headaches and brain fog and chronic pain but no specific muscle weakness.  She has not noted any palpitations.  She does state that she is supposed to be on potassium once a day but had run out 2 weeks ago.  Of note, she also had received her flu vaccine and is complaining of some chills and bodyaches.   Home Medications Prior to Admission medications   Medication Sig Start Date End Date Taking? Authorizing Provider  amLODipine (NORVASC) 2.5 MG tablet Take 10 mg by mouth daily.    [provider]  Ascorbic Acid (VITAMIN C PO) Take 1 tablet by mouth daily.    [provider]  Calcium Carbonate-Vitamin D (CALCIUM + D PO) Take 1 tablet by mouth daily.    [provider]  clobetasol ointment (TEMOVATE) 0.05 % Apply 1 Application topically 2 (two) times daily. 07/05/22   Particia Nearing, PA-C  cyclobenzaprine (FLEXERIL) 10 MG tablet Take 1 tablet (10 mg total) by mouth 3 (three) times daily as needed. Patient taking differently: Take 20 mg by mouth 2 (two) times daily. 08/01/18   Joni Reining, PA-C  diclofenac Sodium (VOLTAREN) 1 % GEL Apply 2 g topically 4 (four) times daily. 08/03/21   [provider]  dicyclomine (BENTYL) 20 MG tablet Take 1 tablet (20 mg total) by mouth every 6 (six) hours. Patient taking differently:  Take 20 mg by mouth 3 (three) times daily before meals. 03/05/16   Tonye Pearson, MD  DULoxetine (CYMBALTA) 20 MG capsule Take 20 mg by mouth every morning.     [provider]  fluticasone (FLONASE) 50 MCG/ACT nasal spray     [provider]  HYDROcodone-acetaminophen (NORCO/VICODIN) 5-325 MG tablet Take by mouth. 11/20/21   [provider]  levocetirizine (XYZAL) 5 MG tablet Take 1 tablet (5 mg total) by mouth every evening. 07/05/22   Particia Nearing, PA-C  lidocaine (XYLOCAINE) 2 % solution Use as directed 15 mLs in the mouth or throat as needed for mouth pain. Dab on mouth ulcer as needed for mouth pain 02/14/23   Cathlean Marseilles A, NP  lisinopril (ZESTRIL) 40 MG tablet Take 40 mg by mouth every morning.     [provider]  lisinopril-hydrochlorothiazide (ZESTORETIC) 20-12.5 MG tablet Take 2 tablets by mouth daily. 03/11/22   [provider]  meloxicam (MOBIC) 7.5 MG tablet Take 1 tablet (7.5 mg total) by mouth 2 (two) times daily. 03/05/16   Tonye Pearson, MD  Multiple Vitamins-Minerals (MULTIVITAMIN WITH MINERALS) tablet Take 1 tablet by mouth daily.    [provider]  omeprazole (PRILOSEC) 20 MG capsule Take 20 mg by mouth daily. 01/13/23   [provider]  PARoxetine (PAXIL) 10 MG tablet Take 10 mg by mouth every morning. Take with 20 mg to equal 30 mg  [provider]  PARoxetine (PAXIL) 20 MG tablet Take 20 mg by mouth See admin instructions. Take with 10 mg to equal 30 mg    [provider]  polyethylene glycol (MIRALAX / GLYCOLAX) 17 g packet Take 17 g by mouth daily as needed.    [provider]  promethazine (PHENERGAN) 25 MG tablet Take 25 mg by mouth every 8 (eight) hours as needed.    [provider]  QUEtiapine (SEROQUEL) 25 MG tablet Take 25 mg by mouth 2 (two) times daily.    [provider]  SUMAtriptan (IMITREX) 50 MG tablet Take 100 mg by mouth every 2  (two) hours as needed for migraine. May repeat in 2 hours if headache persists or recurs.    [provider]  traMADol (ULTRAM-ER) 200 MG 24 hr tablet Take 200 mg by mouth daily.    [provider]      Allergies    Pholcodine, Codeine, Methocarbamol, Oxycodone, and Tizanidine    Review of Systems   Review of Systems  All other systems reviewed and are negative.   Physical Exam Updated Vital Signs BP (!) 183/99   Pulse 83   Temp 99.3 F (37.4 C) (Oral)   Resp 18   Ht 5\' 8"  (1.727 m)   Wt 91.2 kg   SpO2 99%   BMI 30.56 kg/m  Physical Exam Vitals and nursing note reviewed.   57 year old female, resting comfortably and in no acute distress. Vital signs are significant for elevated blood pressure. Oxygen saturation is 99%, which is normal. Head is normocephalic and atraumatic. PERRLA, EOMI. Oropharynx is clear. Neck is nontender and supple without adenopathy or JVD. Back is nontender. Lungs are clear without rales, wheezes, or rhonchi. Chest is nontender. Heart has regular rate and rhythm without murmur. Abdomen is soft, flat, nontender. Extremities have 1+ edema, full range of motion is present. Skin is warm and dry without rash. Neurologic: Mental status is normal, cranial nerves are intact, moves all extremities equally.  ED Results / Procedures / Treatments   Labs (all labs ordered are listed, but only abnormal results are displayed) Labs Reviewed  CBC WITH DIFFERENTIAL/PLATELET - Abnormal; Notable for the following components:      Result Value   WBC 11.9 (*)    Neutro Abs 9.1 (*)    All other components within normal limits  COMPREHENSIVE METABOLIC PANEL - Abnormal; Notable for the following components:   Potassium 2.3 (*)    Chloride 94 (*)    Glucose, Bld 129 (*)    All other components within normal limits  MAGNESIUM    EKG EKG Interpretation Date/Time:  Tuesday October 11 2023 19:48:53 EDT Ventricular Rate:  88 PR Interval:  192 QRS  Duration:  78 QT Interval:  376 QTC Calculation: 454 R Axis:   28  Text Interpretation: Normal sinus rhythm Low voltage QRS Septal infarct , age undetermined Abnormal ECG When compared with ECG of 01-Jun-2023 15:00, No significant change was found Confirmed by Dione Booze ([id number]) on 10/11/2023 11:04:52 PM  Radiology No results found.  Procedures Procedures  Cardiac monitor shows normal sinus rhythm, per my interpretation.  Medications Ordered in ED Medications  potassium chloride SA (KLOR-CON M) CR tablet 40 mEq (has no administration in time range)  potassium chloride 10 mEq in 100 mL IVPB (has no administration in time range)  acetaminophen (TYLENOL) tablet 650 mg (has no administration in time range)    ED Course/ Medical Decision Making/  A&P   {   Click here for ABCD2, HEART and other calculatorsREFRESH Note before signing :1}                              Medical Decision Making Amount and/or Complexity of Data Reviewed Labs: ordered. ECG/medicine tests: ordered.   Hypokalemia, apparently diuretic induced.  Our medication list is not up-to-date but I reviewed her record on care everywhere and note based on her office visit earlier today that she is supposed to be taking hydrochlorothiazide 25 mg daily.  She is also supposed to be taking potassium 20 mill equivalents once a day.  Her physician had written a new prescription for potassium 20 mEq daily.  I reviewed her electrocardiogram and my interpretation is sinus rhythm with low voltage and unchanged from prior.  No ECG changes of severe hypokalemia.  I have reviewed her laboratory tests, and my interpretation is severe hypokalemia, mildly elevated WBC which is nonspecific, elevated random glucose level which will need to be followed as an outpatient.  I also note in Care Everywhere that her magnesium was 2.0 which is satisfactory, no need to repeat that test here.  Looking back, patient's potassium levels have varied between  2.8 and 3.9 in the [facility] system, between 2.4 and 3.9 in our system.  She has never had a recorded potassium that I can find it is greater than 4.  I suspect that she would benefit from being on a potassium sparing agent along with her diuretic.  Since she does have edema, she clearly does need ongoing diuretic use.  I have ordered intravenous and oral potassium.  CRITICAL CARE Performed by: Dione Booze Total critical care time: *** minutes Critical care time was exclusive of separately billable procedures and treating other patients. Critical care was necessary to treat or prevent imminent or life-threatening deterioration. Critical care was time spent personally by me on the following activities: development of treatment plan with patient and/or surrogate as well as nursing, discussions with consultants, evaluation of patient's response to treatment, examination of patient, obtaining history from patient or surrogate, ordering and performing treatments and interventions, ordering and review of laboratory studies, ordering and review of radiographic studies, pulse oximetry and re-evaluation of patient's condition.  {Document critical care time when appropriate:1} {Document review of labs and clinical decision tools ie heart score, Chads2Vasc2 etc:1}  {Document your independent review of radiology images, and any outside records:1} {Document your discussion with family members, caretakers, and with consultants:1} {Document social determinants of health affecting pt's care:1} {Document your decision making why or why not admission, treatments were needed:1} Final Clinical Impression(s) / ED Diagnoses Final diagnoses:  None    Rx / DC Orders ED Discharge Orders     None

## 2023-10-11 NOTE — ED Triage Notes (Signed)
Pt stated that PCP called and told her that Potassium is 2.2  Pt complains of HA

## 2023-10-12 MED ORDER — POTASSIUM CHLORIDE CRYS ER 20 MEQ PO TBCR: 20.0000 meq | EXTENDED_RELEASE_TABLET | Freq: Two times a day (BID) | ORAL | 0 refills | Status: DC

## 2023-10-12 NOTE — Discharge Instructions (Addendum)
Please take your potassium 3 times a day for the next 10 days.  After that, you can drop it back to once a day.  You will need to work with your primary care provider to monitor your potassium and make sure that your potassium dose is adequate.  You may wish to discuss with your primary care provider whether you should actually be on a diuretic which limits your body's loss of potassium.

## 2023-11-08 ENCOUNTER — Encounter (HOSPITAL_COMMUNITY): Payer: Self-pay

## 2023-11-08 ENCOUNTER — Other Ambulatory Visit: Payer: Self-pay

## 2023-11-08 ENCOUNTER — Emergency Department (HOSPITAL_COMMUNITY)
Admission: EM | Admit: 2023-11-08 | Discharge: 2023-11-08 | Disposition: A | Discharge status: Home / Self Care | Payer: Commercial Managed Care - HMO | Attending: Emergency Medicine | Admitting: Emergency Medicine

## 2023-11-08 DIAGNOSIS — E876 Hypokalemia: Secondary | ICD-10-CM | POA: Insufficient documentation

## 2023-11-08 LAB — BASIC METABOLIC PANEL
Anion gap: 6 (ref 5–15)
BUN: 11 mg/dL (ref 6–20)
CO2: 26 mmol/L (ref 22–32)
Calcium: 9 mg/dL (ref 8.9–10.3)
Chloride: 102 mmol/L (ref 98–111)
Creatinine, Ser: 0.74 mg/dL (ref 0.44–1.00)
GFR, Estimated: >60 mL/min (ref >60)
Glucose, Bld: 176 mg/dL — ABNORMAL HIGH (ref 70–99)
Potassium: 2.9 mmol/L — ABNORMAL LOW (ref 3.5–5.1)
Sodium: 134 mmol/L — ABNORMAL LOW (ref 135–145)

## 2023-11-08 LAB — MAGNESIUM: Magnesium: 2 mg/dL (ref 1.7–2.4)

## 2023-11-08 MED ORDER — POTASSIUM CHLORIDE CRYS ER 20 MEQ PO TBCR
40.0000 meq | EXTENDED_RELEASE_TABLET | Freq: Once | ORAL | Status: AC
  Administered 2023-11-08: 40 meq via ORAL
  Filled 2023-11-08: qty 2

## 2023-11-08 MED ORDER — POTASSIUM CHLORIDE 10 MEQ/100ML IV SOLN
10.0000 meq | Freq: Once | INTRAVENOUS | Status: AC
  Administered 2023-11-08: 10 meq via INTRAVENOUS
  Filled 2023-11-08: qty 100

## 2023-11-08 MED ORDER — POTASSIUM CHLORIDE CRYS ER 20 MEQ PO TBCR: 20.0000 meq | EXTENDED_RELEASE_TABLET | Freq: Two times a day (BID) | ORAL | 0 refills | Status: AC

## 2023-11-08 NOTE — ED Notes (Signed)
Reviewed D/C information with the patient, pt verbalized understanding. No additional concerns at this time.

## 2023-11-08 NOTE — ED Provider Notes (Signed)
[place] EMERGENCY DEPARTMENT AT [facility] Provider Note   CSN: [id number] Arrival date & time: 11/08/23  1857     History  No chief complaint on file.   Carolyn Allen is a 57 y.o. female.  This is a 57 year old female here today with low potassium.  She has a history of chronic low potassium, followed up with her PCP for labs today and her potassium was low.        Home Medications Prior to Admission medications   Medication Sig Start Date End Date Taking? Authorizing Provider  amLODipine (NORVASC) 10 MG tablet Take 10 mg by mouth daily. 10/11/23  Yes [provider]  Ascorbic Acid (VITAMIN C PO) Take 1 tablet by mouth daily.   Yes [provider]  Calcium Carbonate-Vitamin D (CALCIUM + D PO) Take 1 tablet by mouth daily.   Yes [provider]  cyclobenzaprine (FLEXERIL) 10 MG tablet Take 1 tablet (10 mg total) by mouth 3 (three) times daily as needed. Patient taking differently: Take 20 mg by mouth 2 (two) times daily. 08/01/18  Yes Joni Reining, PA-C  dicyclomine (BENTYL) 20 MG tablet Take 1 tablet (20 mg total) by mouth every 6 (six) hours. Patient taking differently: Take 20 mg by mouth 3 (three) times daily before meals. 03/05/16  Yes Tonye Pearson, MD  DULoxetine (CYMBALTA) 20 MG capsule Take 20 mg by mouth every morning.    Yes [provider]  fluticasone (FLONASE) 50 MCG/ACT nasal spray Place 4 sprays into both nostrils at bedtime.   Yes [provider]  HYDROcodone-acetaminophen (NORCO) 10-325 MG tablet Take 1 tablet by mouth every 6 (six) hours as needed for moderate pain (pain score 4-6).   Yes [provider]  lisinopril-hydrochlorothiazide (ZESTORETIC) 20-12.5 MG tablet Take 2 tablets by mouth daily. 03/11/22  Yes [provider]  loratadine (CLARITIN) 10 MG tablet Take 10 mg by mouth daily as needed for allergies. 10/21/23  Yes [provider]  losartan (COZAAR) 25 MG  tablet Take 25 mg by mouth daily.   Yes [provider]  meloxicam (MOBIC) 15 MG tablet Take 30 mg by mouth in the morning and at bedtime. 09/01/23  Yes [provider]  Multiple Vitamins-Minerals (MULTIVITAMIN WITH MINERALS) tablet Take 1 tablet by mouth daily.   Yes [provider]  omeprazole (PRILOSEC) 20 MG capsule Take 20 mg by mouth daily. 01/13/23  Yes [provider]  PARoxetine (PAXIL) 10 MG tablet Take 10 mg by mouth every morning. Take with 20 mg to equal 30 mg   Yes [provider]  PARoxetine (PAXIL) 20 MG tablet Take 20 mg by mouth See admin instructions. Take with 10 mg to equal 30 mg   Yes [provider]  polyethylene glycol (MIRALAX / GLYCOLAX) 17 g packet Take 17 g by mouth daily as needed.   Yes [provider]  potassium chloride SA (KLOR-CON M) 20 MEQ tablet Take 1 tablet (20 mEq total) by mouth 2 (two) times daily for 7 days. 11/08/23 11/15/23 Yes Arletha Pili, DO  promethazine (PHENERGAN) 25 MG tablet Take 25 mg by mouth every 8 (eight) hours as needed.   Yes [provider]  QUEtiapine (SEROQUEL) 25 MG tablet Take 25 mg by mouth 2 (two) times daily.   Yes [provider]  SUMAtriptan (IMITREX) 100 MG tablet Take 100 mg by mouth every 2 (two) hours as needed for migraine or headache. 10/11/23  Yes [provider]  traMADol (ULTRAM-ER) 200 MG 24 hr tablet Take 200 mg by mouth daily.   Yes [provider]      Allergies    Pholcodine, Codeine, Methocarbamol, Oxycodone, and Tizanidine    Review of Systems   Review of Systems  Physical Exam Updated Vital Signs BP (!) 155/84   Pulse 89   Temp 98.5 F (36.9 C) (Oral)   Resp 18   Ht 5\' 8"  (1.727 m)   Wt 91.2 kg   SpO2 97%   BMI 30.56 kg/m  Physical Exam Vitals reviewed.  Cardiovascular:     Rate and Rhythm: Normal rate.  Pulmonary:     Effort: Pulmonary effort is normal.  Neurological:     Mental Status: She is  alert.     ED Results / Procedures / Treatments   Labs (all labs ordered are listed, but only abnormal results are displayed) Labs Reviewed  BASIC METABOLIC PANEL - Abnormal; Notable for the following components:      Result Value   Sodium 134 (*)    Potassium 2.9 (*)    Glucose, Bld 176 (*)    All other components within normal limits  MAGNESIUM    EKG EKG Interpretation Date/Time:  Tuesday November 08 2023 19:49:19 EST Ventricular Rate:  94 PR Interval:  188 QRS Duration:  89 QT Interval:  373 QTC Calculation: 467 R Axis:   36  Text Interpretation: Sinus rhythm Low voltage, precordial leads Anteroseptal infarct, old Borderline repolarization abnormality Minimal ST elevation, lateral leads Baseline wander in lead(s) II III aVR aVL aVF V1 V2 V3 V4 V5 V6 Confirmed by Anders Simmonds [phone]) on 11/08/2023 8:06:53 PM  Radiology No results found.  Procedures Procedures    Medications Ordered in ED Medications  potassium chloride SA (KLOR-CON M) CR tablet 40 mEq (40 mEq Oral Given 11/08/23 1931)  potassium chloride 10 mEq in 100 mL IVPB (0 mEq Intravenous Stopped 11/08/23 2054)    ED Course/ Medical Decision Making/ A&P                                 Medical Decision Making 57 year old female here today for hypokalemia.  Plan-is a chronic issue for the patient.  Will provide her some IV potassium, as well as oral.  Will check a magnesium.  My dependent review the patient's EKG does not show any U waves.  Reassessment 9:15 PM-potassium 2.9.  She has received IV potassium and oral potassium.  Magnesium normal.  Will discharge patient with additional potassium supplementation.  She will follow-up with her regular doctor.  Amount and/or Complexity of Data Reviewed Labs: ordered.  Risk Prescription drug management.           Final Clinical Impression(s) / ED Diagnoses Final diagnoses:  Hypokalemia    Rx / DC Orders ED Discharge Orders           Ordered    potassium chloride SA (KLOR-CON M) 20 MEQ tablet  2 times daily        11/08/23 2115              Anders Simmonds T, DO 11/08/23 2116

## 2023-11-08 NOTE — Discharge Instructions (Signed)
Your potassium today was 2.9.  You received IV potassium and oral potassium.  I would like you to take 20 mill equivalents of potassium once in the morning once in the evening for the next 1 week.  Follow-up with your primary care doctor.

## 2023-11-08 NOTE — ED Triage Notes (Addendum)
Doctor sent pt after receiving abnormal lab work.

## 2024-01-18 ENCOUNTER — Emergency Department (HOSPITAL_COMMUNITY)
Admission: EM | Admit: 2024-01-18 | Discharge: 2024-01-18 | Disposition: A | Discharge status: Home / Self Care | Payer: Commercial Managed Care - HMO | Attending: Emergency Medicine | Admitting: Emergency Medicine

## 2024-01-18 ENCOUNTER — Emergency Department (HOSPITAL_COMMUNITY): Payer: Commercial Managed Care - HMO

## 2024-01-18 ENCOUNTER — Encounter (HOSPITAL_COMMUNITY): Payer: Self-pay

## 2024-01-18 ENCOUNTER — Other Ambulatory Visit: Payer: Self-pay

## 2024-01-18 DIAGNOSIS — I10 Essential (primary) hypertension: Secondary | ICD-10-CM | POA: Diagnosis not present

## 2024-01-18 DIAGNOSIS — E876 Hypokalemia: Secondary | ICD-10-CM | POA: Insufficient documentation

## 2024-01-18 DIAGNOSIS — Z79899 Other long term (current) drug therapy: Secondary | ICD-10-CM | POA: Insufficient documentation

## 2024-01-18 DIAGNOSIS — R899 Unspecified abnormal finding in specimens from other organs, systems and tissues: Secondary | ICD-10-CM

## 2024-01-18 DIAGNOSIS — R799 Abnormal finding of blood chemistry, unspecified: Secondary | ICD-10-CM | POA: Diagnosis present

## 2024-01-18 LAB — TROPONIN I (HIGH SENSITIVITY)
Troponin I (High Sensitivity): <2 ng/L (ref <18)
Troponin I (High Sensitivity): <2 ng/L (ref <18)

## 2024-01-18 LAB — COMPREHENSIVE METABOLIC PANEL
ALT: 18 U/L (ref 0–44)
AST: 21 U/L (ref 15–41)
Albumin: 4.6 g/dL (ref 3.5–5.0)
Alkaline Phosphatase: 82 U/L (ref 38–126)
Anion gap: 12 (ref 5–15)
BUN: 12 mg/dL (ref 6–20)
CO2: 26 mmol/L (ref 22–32)
Calcium: 9.6 mg/dL (ref 8.9–10.3)
Chloride: 100 mmol/L (ref 98–111)
Creatinine, Ser: 0.69 mg/dL (ref 0.44–1.00)
GFR, Estimated: >60 mL/min (ref >60)
Glucose, Bld: 111 mg/dL — ABNORMAL HIGH (ref 70–99)
Potassium: 3.4 mmol/L — ABNORMAL LOW (ref 3.5–5.1)
Sodium: 138 mmol/L (ref 135–145)
Total Bilirubin: 0.5 mg/dL (ref 0.0–1.2)
Total Protein: 7.4 g/dL (ref 6.5–8.1)

## 2024-01-18 LAB — CBC WITH DIFFERENTIAL/PLATELET
Abs Immature Granulocytes: 0.04 10*3/uL (ref 0.00–0.07)
Basophils Absolute: 0 10*3/uL (ref 0.0–0.1)
Basophils Relative: 1 %
Eosinophils Absolute: 0.2 10*3/uL (ref 0.0–0.5)
Eosinophils Relative: 2 %
HCT: 38.4 % (ref 36.0–46.0)
Hemoglobin: 12.5 g/dL (ref 12.0–15.0)
Immature Granulocytes: 1 %
Lymphocytes Relative: 30 %
Lymphs Abs: 2.7 10*3/uL (ref 0.7–4.0)
MCH: 30.2 pg (ref 26.0–34.0)
MCHC: 32.6 g/dL (ref 30.0–36.0)
MCV: 92.8 fL (ref 80.0–100.0)
Monocytes Absolute: 0.7 10*3/uL (ref 0.1–1.0)
Monocytes Relative: 8 %
Neutro Abs: 5.1 10*3/uL (ref 1.7–7.7)
Neutrophils Relative %: 58 %
Platelets: 225 10*3/uL (ref 150–400)
RBC: 4.14 MIL/uL (ref 3.87–5.11)
RDW: 13.2 % (ref 11.5–15.5)
WBC: 8.8 10*3/uL (ref 4.0–10.5)
nRBC: 0 % (ref 0.0–0.2)

## 2024-01-18 LAB — MAGNESIUM: Magnesium: 2.1 mg/dL (ref 1.7–2.4)

## 2024-01-18 NOTE — ED Provider Notes (Signed)
 [place] EMERGENCY DEPARTMENT AT [facility] Provider Note   CSN: [id number] Arrival date & time: 01/18/24  1501     History  Chief Complaint  Patient presents with   hypokalemia    Carolyn Allen is a 58 y.o. female past medical history significant for hypertension presents today for hypokalemia.  Patient states that she had lab work recently drawn by her doctors and she received a phone call advising her to come here for evaluation.  Patient denies chest pain, fever, chills, nausea, vomiting, diarrhea, abdominal pain, or shortness of breath.  HPI     Home Medications Prior to Admission medications   Medication Sig Start Date End Date Taking? Authorizing Provider  amLODipine (NORVASC) 10 MG tablet Take 10 mg by mouth daily. 10/11/23   [provider]  Ascorbic Acid (VITAMIN C PO) Take 1 tablet by mouth daily.    [provider]  Calcium Carbonate-Vitamin D (CALCIUM + D PO) Take 1 tablet by mouth daily.    [provider]  cyclobenzaprine  (FLEXERIL ) 10 MG tablet Take 1 tablet (10 mg total) by mouth 3 (three) times daily as needed. Patient taking differently: Take 20 mg by mouth 2 (two) times daily. 08/01/18   Claudene Tanda POUR, PA-C  dicyclomine  (BENTYL ) 20 MG tablet Take 1 tablet (20 mg total) by mouth every 6 (six) hours. Patient taking differently: Take 20 mg by mouth 3 (three) times daily before meals. 03/05/16   Joylene Lamar SQUIBB, MD  DULoxetine (CYMBALTA) 20 MG capsule Take 20 mg by mouth every morning.     [provider]  fluticasone (FLONASE) 50 MCG/ACT nasal spray Place 4 sprays into both nostrils at bedtime.    [provider]  HYDROcodone -acetaminophen  (NORCO) 10-325 MG tablet Take 1 tablet by mouth every 6 (six) hours as needed for moderate pain (pain score 4-6).    [provider]  lisinopril -hydrochlorothiazide  (ZESTORETIC ) 20-12.5 MG tablet Take 2 tablets by mouth daily. 03/11/22   [provider]  loratadine  (CLARITIN ) 10 MG tablet Take 10 mg by mouth daily as needed for allergies. 10/21/23   [provider]  losartan (COZAAR) 25 MG tablet Take 25 mg by mouth daily.    [provider]  meloxicam  (MOBIC ) 15 MG tablet Take 30 mg by mouth in the morning and at bedtime. 09/01/23   [provider]  Multiple Vitamins-Minerals (MULTIVITAMIN WITH MINERALS) tablet Take 1 tablet by mouth daily.    [provider]  omeprazole (PRILOSEC) 20 MG capsule Take 20 mg by mouth daily. 01/13/23   [provider]  PARoxetine  (PAXIL ) 10 MG tablet Take 10 mg by mouth every morning. Take with 20 mg to equal 30 mg    [provider]  PARoxetine  (PAXIL ) 20 MG tablet Take 20 mg by mouth See admin instructions. Take with 10 mg to equal 30 mg    [provider]  polyethylene glycol (MIRALAX  / GLYCOLAX ) 17 g packet Take 17 g by mouth daily as needed.    [provider]  potassium chloride  SA (KLOR-CON  M) 20 MEQ tablet Take 1 tablet (20 mEq total) by mouth 2 (two) times daily for 7 days. 11/08/23 11/15/23  Mannie Pac T, DO  promethazine  (PHENERGAN ) 25 MG tablet Take 25 mg by mouth every 8 (eight) hours as needed.    [provider]  QUEtiapine (SEROQUEL) 25 MG tablet Take 25 mg by mouth 2 (two) times daily.    [provider]  SUMAtriptan  (  IMITREX ) 100 MG tablet Take 100 mg by mouth every 2 (two) hours as needed for migraine or headache. 10/11/23   [provider]  traMADol  (ULTRAM -ER) 200 MG 24 hr tablet Take 200 mg by mouth daily.    [provider]      Allergies    Pholcodine, Codeine, Methocarbamol , Oxycodone , and Tizanidine    Review of Systems   Review of Systems  All other systems reviewed and are negative.   Physical Exam Updated Vital Signs BP (!) 144/79 (BP Location: Right Arm)   Pulse 69   Temp 98 F (36.7 C) (Oral)   Resp 18   Ht 5' 8 (1.727 m)   Wt 91.3 kg   SpO2 98%    BMI 30.60 kg/m  Physical Exam Vitals and nursing note reviewed.  Constitutional:      General: She is not in acute distress.    Appearance: Normal appearance. She is well-developed. She is not ill-appearing, toxic-appearing or diaphoretic.  HENT:     Head: Normocephalic and atraumatic.     Right Ear: External ear normal.     Left Ear: External ear normal.     Mouth/Throat:     Mouth: Mucous membranes are moist.  Eyes:     Extraocular Movements: Extraocular movements intact.     Conjunctiva/sclera: Conjunctivae normal.  Cardiovascular:     Rate and Rhythm: Normal rate and regular rhythm.     Pulses: Normal pulses.     Heart sounds: Normal heart sounds. No murmur heard. Pulmonary:     Effort: Pulmonary effort is normal. No respiratory distress.     Breath sounds: Normal breath sounds.  Abdominal:     Palpations: Abdomen is soft.     Tenderness: There is no abdominal tenderness.  Musculoskeletal:        General: No swelling.     Cervical back: Neck supple.  Skin:    General: Skin is warm and dry.     Capillary Refill: Capillary refill takes less than 2 seconds.  Neurological:     General: No focal deficit present.     Mental Status: She is alert.     Motor: No weakness.  Psychiatric:        Mood and Affect: Mood normal.     ED Results / Procedures / Treatments   Labs (all labs ordered are listed, but only abnormal results are displayed) Labs Reviewed  COMPREHENSIVE METABOLIC PANEL - Abnormal; Notable for the following components:      Result Value   Potassium 3.4 (*)    Glucose, Bld 111 (*)    All other components within normal limits  CBC WITH DIFFERENTIAL/PLATELET  MAGNESIUM   TROPONIN I (HIGH SENSITIVITY)  TROPONIN I (HIGH SENSITIVITY)    EKG None  Radiology DG Chest 2 View Result Date: 01/18/2024 CLINICAL DATA:  Chest pain. EXAM: CHEST - 2 VIEW COMPARISON:  04/24/2023. FINDINGS: Bilateral lung fields are clear. Bilateral costophrenic angles are clear.  Normal cardio-mediastinal silhouette. No acute osseous abnormalities. The soft tissues are within normal limits. Neurostimulator leads noted in the posterior aspect of the spinal canal at midthoracic level. IMPRESSION: No active cardiopulmonary disease. Electronically Signed   By: Ree Molt M.D.   On: 01/18/2024 17:52    Procedures Procedures    Medications Ordered in ED Medications - No data to display  ED Course/ Medical Decision Making/ A&P  Medical Decision Making  This patient presents to the ED with chief complaint(s) of hypokalemia with pertinent past medical history of hypertension which further complicates the presenting complaint. The complaint involves an extensive differential diagnosis and also carries with it a high risk of complications and morbidity.    The differential diagnosis includes hypokalemia, electrolyte abnormality, ACS, arrhythmia  Additional history obtained: Records reviewed Care Everywhere/External Records  ED Course and Reassessment:   Independent labs interpretation:  The following labs were independently interpreted:  CBC: No notable findings CMP: Mildly decreased potassium at 3.4 Magnesium : 2.1 Troponin: <2 EKG: Normal sinus rhythm  Independent visualization of imaging: - I independently visualized the following imaging with scope of interpretation limited to determining acute life threatening conditions related to emergency care: X-ray, which revealed no active cardiopulmonary disease  Consultation: - Consulted or discussed management/test interpretation w/ external professional: None  Consideration for admission or further workup: Considered for mission further evaluation vital signs, physical exam, labs, and imaging were reassuring.  Patient's potassium was 3.4 following ED.  Patient encouraged to continue potassium supplementation and follow-up with primary care physician in the upcoming week for further  evaluation and treatment.        Final Clinical Impression(s) / ED Diagnoses Final diagnoses:  Abnormal laboratory test result    Rx / DC Orders ED Discharge Orders     None         Francis Ileana LOISE DEVONNA 01/18/24 2032    Suzette Pac, MD 01/23/24 1018

## 2024-01-18 NOTE — Discharge Instructions (Addendum)
 Today you were seen for an abnormal potassium level.  Please continue taking your potassium supplementation.  Please follow-up with your primary care physician for further evaluation and treatment upcoming week.  Thank you for letting us  treat you today. After reviewing your labs and imaging, I feel you are safe to go home. Please follow up with your PCP in the next several days and provide them with your records from this visit. Return to the Emergency Room if pain becomes severe or symptoms worsen.

## 2024-01-18 NOTE — ED Notes (Signed)
 Pt refused 2nd trop per lab tech

## 2024-01-18 NOTE — ED Provider Triage Note (Signed)
 Emergency Medicine Provider Triage Evaluation Note  Carolyn Allen , a 58 y.o. female  was evaluated in triage.  Pt complains of generalized weakness and muscle spasm.  She was seen by her primary provider 2 days ago where she has been evaluated for possible kidney disease, blood work from that visit revealing for hypokalemia with a potassium of 2.8.  She was called by her PCP and advised to come here for further evaluation and treatment of this low potassium as she is currently on maximum oral potassium dosing.  She does have intermittent chest pain since last week, random, right-sided, described as pressure sensation with shortness of breath, she is not currently having this symptom but did prior to arrival.  Review of Systems  Positive: Weakness, muscle spasm, chest pressure, shortness of breath Negative: Fever, palpitations, nausea, vomiting, abdominal pain  Physical Exam  BP (!) 145/80 (BP Location: Right Arm)   Pulse 81   Temp 98.2 F (36.8 C) (Oral)   Resp 18   Ht 5' 8 (1.727 m)   Wt 91.3 kg   SpO2 100%   BMI 30.60 kg/m  Gen:   Awake, no distress   Resp:  Normal effort  MSK:   Moves extremities without difficulty  Other:    Medical Decision Making  Medically screening exam initiated at 5:52 PM.  Appropriate orders placed.  Carolyn Allen was informed that the remainder of the evaluation will be completed by another provider, this initial triage assessment does not replace that evaluation, and the importance of remaining in the ED until their evaluation is complete.     Birdena Clarity, PA-C 01/18/24 1755

## 2024-01-18 NOTE — ED Triage Notes (Signed)
Pt arrived via POV from home due to hypokalemia. Pt reports having recent lab work drawn and she received a phone call advising her to come to the ER for evaluation. Pt endorse mild chest discomfort.

## 2024-01-18 NOTE — ED Notes (Signed)
Pt ambulated independently with normal gait to restroom.

## 2024-03-04 ENCOUNTER — Encounter (HOSPITAL_COMMUNITY): Payer: Self-pay

## 2024-03-04 ENCOUNTER — Emergency Department (HOSPITAL_COMMUNITY)
Admission: EM | Admit: 2024-03-04 | Discharge: 2024-03-04 | Disposition: A | Discharge status: Home / Self Care | Attending: Emergency Medicine | Admitting: Emergency Medicine

## 2024-03-04 ENCOUNTER — Other Ambulatory Visit: Payer: Self-pay

## 2024-03-04 DIAGNOSIS — Z79899 Other long term (current) drug therapy: Secondary | ICD-10-CM | POA: Insufficient documentation

## 2024-03-04 DIAGNOSIS — I10 Essential (primary) hypertension: Secondary | ICD-10-CM | POA: Insufficient documentation

## 2024-03-04 DIAGNOSIS — E876 Hypokalemia: Secondary | ICD-10-CM | POA: Insufficient documentation

## 2024-03-04 LAB — CBC WITH DIFFERENTIAL/PLATELET
Abs Immature Granulocytes: 0.03 10*3/uL (ref 0.00–0.07)
Basophils Absolute: 0 10*3/uL (ref 0.0–0.1)
Basophils Relative: 0 %
Eosinophils Absolute: 0.1 10*3/uL (ref 0.0–0.5)
Eosinophils Relative: 1 %
HCT: 38.5 % (ref 36.0–46.0)
Hemoglobin: 13 g/dL (ref 12.0–15.0)
Immature Granulocytes: 0 %
Lymphocytes Relative: 22 %
Lymphs Abs: 2.1 10*3/uL (ref 0.7–4.0)
MCH: 31.3 pg (ref 26.0–34.0)
MCHC: 33.8 g/dL (ref 30.0–36.0)
MCV: 92.5 fL (ref 80.0–100.0)
Monocytes Absolute: 0.6 10*3/uL (ref 0.1–1.0)
Monocytes Relative: 7 %
Neutro Abs: 6.5 10*3/uL (ref 1.7–7.7)
Neutrophils Relative %: 70 %
Platelets: 282 10*3/uL (ref 150–400)
RBC: 4.16 MIL/uL (ref 3.87–5.11)
RDW: 12.6 % (ref 11.5–15.5)
WBC: 9.3 10*3/uL (ref 4.0–10.5)
nRBC: 0 % (ref 0.0–0.2)

## 2024-03-04 LAB — BASIC METABOLIC PANEL
Anion gap: 13 (ref 5–15)
BUN: 8 mg/dL (ref 6–20)
CO2: 27 mmol/L (ref 22–32)
Calcium: 9.4 mg/dL (ref 8.9–10.3)
Chloride: 97 mmol/L — ABNORMAL LOW (ref 98–111)
Creatinine, Ser: 0.74 mg/dL (ref 0.44–1.00)
GFR, Estimated: >60 mL/min (ref >60)
Glucose, Bld: 136 mg/dL — ABNORMAL HIGH (ref 70–99)
Potassium: 2.9 mmol/L — ABNORMAL LOW (ref 3.5–5.1)
Sodium: 137 mmol/L (ref 135–145)

## 2024-03-04 LAB — MAGNESIUM: Magnesium: 1.8 mg/dL (ref 1.7–2.4)

## 2024-03-04 MED ORDER — POTASSIUM CHLORIDE 10 MEQ/100ML IV SOLN
10.0000 meq | INTRAVENOUS | Status: AC
  Administered 2024-03-04 (×2): 10 meq via INTRAVENOUS
  Filled 2024-03-04 (×2): qty 100

## 2024-03-04 MED ORDER — MAGNESIUM SULFATE 2 GM/50ML IV SOLN
2.0000 g | Freq: Once | INTRAVENOUS | Status: AC
  Administered 2024-03-04: 2 g via INTRAVENOUS
  Filled 2024-03-04: qty 50

## 2024-03-04 MED ORDER — POTASSIUM CHLORIDE CRYS ER 20 MEQ PO TBCR
40.0000 meq | EXTENDED_RELEASE_TABLET | Freq: Once | ORAL | Status: AC
  Administered 2024-03-04: 40 meq via ORAL
  Filled 2024-03-04: qty 2

## 2024-03-04 NOTE — Discharge Instructions (Addendum)
 If you are still taking any medications that have hydrochlorothiazide in it, you need to stop that medication.  Delicious Potassium-Rich Foods One of the great things about increasing the amount of potassium in your diet is that so many delicious foods contain it. Ten potassium-packed foods are listed below.  1. Sweet potatoes One cup of sweet potato has 16% of the potassium DV. Plus, sweet potato is low in fat and a good source of fiber, carbs, and vitamin A, making it a great alternative to white potatoes.  2. Avocados A whole avocado provides nearly 15% of the daily potassium requirement. You also get significant amounts of vitamin K, folate, and healthy fats from avocados. Plus, they're low in sodium, which is helpful for people seeking potassium to address high blood pressure. 3. Watermelon   Approximately an eighth of a standard watermelon (two wedges) delivers almost 14% of the potassium DV. This delicious fruit also contains protein, fiber, magnesium, and vitamins C and A. 4. Coconut water   One cup of coconut water contains 13% of the DV for potassium and is also a good source of manganese, sodium, and magnesium. In addition, it has natural sugars for energy, making it great for hydrating during workouts.  5. Spinach Spinach is an excellent source of potassium. You get 12% of the DV from one cup of frozen spinach and 11% from three cups of raw spinach. And that's just the beginning, as this vegetable is packed with vitamins A and K, folate, and magnesium.  6. Beans In addition to being a healthy source of plant-based protein and complex carbs, beans have plenty of potassium. For example, one cup of white beans has an impressive 21% of the potassium DV. 7. Potatoes A medium-sized potato can provide 12% of the DV for potassium. The actual percentage varies based on the soil in which potatoes are grown, but they're a good source of potassium regardless.  8. Fish  Some types of fish are  excellent sources of potassium. For example, half a filet of cooked salmon provides over 20% of the daily potassium requirement. Salmon and other fatty fish are also rich in heart-healthy omega-3 fatty acids. 9 Beets One cup of boiled beets contains 11% of the DV for potassium, along with nitrate (which supports proper blood vessel function) and folate.  10. Pomegranates   Pomegranates are a very healthy fruit. In addition to 14% of your potassium DV, one whole pomegranate has more protein than many other fruits, folate, and vitamins K and C.  Of course, this isn't a complete list of potassium-rich foods, as many others -- from tomato paste to yams to Swiss chard -- could be added. But the bottom line is that if you need more potassium in your diet, you have options for getting it!

## 2024-03-04 NOTE — ED Provider Notes (Signed)
 [place] EMERGENCY DEPARTMENT AT [facility] Provider Note   CSN: [id number] Arrival date & time: 03/04/24  1812     History  Chief Complaint  Patient presents with   Abnormal Lab    Carolyn Allen is a 58 y.o. female.  Pt is a 58 yo female with pmhx significant for htn, depression, gerd, migraines, and tmj.  Pt has had a problem with her potassium levels.  Her pcp has been trying to change her meds around.  She had blood work done on 3/19 which showed a K level of 2.9.  She was called today and told it was low.  She feels ok.       Home Medications Prior to Admission medications   Medication Sig Start Date End Date Taking? Authorizing Provider  amLODipine (NORVASC) 10 MG tablet Take 10 mg by mouth daily. 10/11/23  Yes [provider]  Ascorbic Acid (VITAMIN C PO) Take 1 tablet by mouth daily.   Yes [provider]  Calcium Carbonate-Vitamin D (CALCIUM + D PO) Take 1 tablet by mouth daily.   Yes [provider]  cyclobenzaprine (FLEXERIL) 10 MG tablet Take 1 tablet (10 mg total) by mouth 3 (three) times daily as needed. Patient taking differently: Take 20 mg by mouth 2 (two) times daily. 08/01/18  Yes Joni Reining, PA-C  dicyclomine (BENTYL) 20 MG tablet Take 1 tablet (20 mg total) by mouth every 6 (six) hours. Patient taking differently: Take 20 mg by mouth 3 (three) times daily before meals. 03/05/16  Yes Tonye Pearson, MD  DULoxetine (CYMBALTA) 20 MG capsule Take 20 mg by mouth every morning.    Yes [provider]  fluticasone (FLONASE) 50 MCG/ACT nasal spray Place 4 sprays into both nostrils at bedtime.   Yes [provider]  HYDROcodone-acetaminophen (NORCO/VICODIN) 5-325 MG tablet Take 1 tablet by mouth every 6 (six) hours as needed for moderate pain (pain score 4-6). 02/10/24  Yes [provider]  lidocaine (LIDODERM) 5 % Place 1 patch onto the skin daily. 02/29/24 03/30/24 Yes [provider]  lisinopril-hydrochlorothiazide (ZESTORETIC) 20-12.5 MG tablet Take 2 tablets by mouth daily. 03/11/22  Yes [provider]  loratadine (CLARITIN) 10 MG tablet Take 10 mg by mouth daily as needed for allergies. 10/21/23  Yes [provider]  losartan (COZAAR) 25 MG tablet Take 25 mg by mouth daily.   Yes [provider]  meloxicam (MOBIC) 15 MG tablet Take 30 mg by mouth in the morning and at bedtime. 09/01/23  Yes [provider]  Multiple Vitamins-Minerals (MULTIVITAMIN WITH MINERALS) tablet Take 1 tablet by mouth daily.   Yes [provider]  omeprazole (PRILOSEC) 20 MG capsule Take 20 mg by mouth daily. 01/13/23  Yes [provider]  PARoxetine (PAXIL) 10 MG tablet Take 10 mg by mouth every morning. Take with 20 mg to equal 30 mg   Yes [provider]  PARoxetine (PAXIL) 20 MG tablet Take 20 mg by mouth See admin instructions. Take with 10 mg to equal 30 mg   Yes [provider]  polyethylene glycol (MIRALAX / GLYCOLAX) 17 g packet Take 17 g by mouth daily as needed for mild constipation.   Yes [provider]  potassium chloride SA (KLOR-CON M) 20 MEQ tablet Take 1 tablet (20 mEq total) by mouth 2 (two) times daily for 7 days. Patient taking differently: Take 40 mEq by mouth 2 (two) times daily. 11/08/23 03/04/24 Yes Andria Meuse,  Vickey Huger, DO  promethazine (PHENERGAN) 25 MG tablet Take 25 mg by mouth every 8 (eight) hours as needed for nausea.   Yes [provider]  QUEtiapine (SEROQUEL) 25 MG tablet Take 25 mg by mouth 2 (two) times daily.   Yes [provider]  SUMAtriptan (IMITREX) 100 MG tablet Take 100 mg by mouth every 2 (two) hours as needed for migraine or headache. 10/11/23  Yes [provider]  traMADol (ULTRAM-ER) 200 MG 24 hr tablet Take 200 mg by mouth daily.   Yes [provider]      Allergies    Pholcodine, Codeine, Methocarbamol, Oxycodone, and Tizanidine    Review  of Systems   Review of Systems  All other systems reviewed and are negative.   Physical Exam Updated Vital Signs BP 136/81   Pulse 71   Temp 98.5 F (36.9 C)   Resp 20   Ht 5\' 7"  (1.702 m)   Wt 93 kg   SpO2 96%   BMI 32.11 kg/m  Physical Exam Vitals and nursing note reviewed.  Constitutional:      Appearance: Normal appearance.  HENT:     Head: Normocephalic and atraumatic.     Right Ear: External ear normal.     Left Ear: External ear normal.     Nose: Nose normal.     Mouth/Throat:     Mouth: Mucous membranes are moist.     Pharynx: Oropharynx is clear.  Eyes:     Extraocular Movements: Extraocular movements intact.     Conjunctiva/sclera: Conjunctivae normal.     Pupils: Pupils are equal, round, and reactive to light.  Cardiovascular:     Rate and Rhythm: Normal rate and regular rhythm.     Pulses: Normal pulses.     Heart sounds: Normal heart sounds.  Pulmonary:     Effort: Pulmonary effort is normal.     Breath sounds: Normal breath sounds.  Abdominal:     General: Abdomen is flat. Bowel sounds are normal.     Palpations: Abdomen is soft.  Musculoskeletal:        General: Normal range of motion.     Cervical back: Normal range of motion and neck supple.  Skin:    General: Skin is warm.     Capillary Refill: Capillary refill takes less than 2 seconds.  Neurological:     General: No focal deficit present.     Mental Status: She is alert and oriented to person, place, and time.  Psychiatric:        Mood and Affect: Mood normal.        Behavior: Behavior normal.     ED Results / Procedures / Treatments   Labs (all labs ordered are listed, but only abnormal results are displayed) Labs Reviewed  BASIC METABOLIC PANEL - Abnormal; Notable for the following components:      Result Value   Potassium 2.9 (*)    Chloride 97 (*)    Glucose, Bld 136 (*)    All other components within normal limits  MAGNESIUM  CBC WITH DIFFERENTIAL/PLATELET     EKG None  Radiology No results found.  Procedures Procedures    Medications Ordered in ED Medications  magnesium sulfate IVPB 2 g 50 mL (0 g Intravenous Stopped 03/04/24 2029)  potassium chloride 10 mEq in 100 mL IVPB (0 mEq Intravenous Stopped 03/04/24 2153)  potassium chloride SA (KLOR-CON M) CR tablet 40 mEq (40 mEq Oral Given 03/04/24 1959)  ED Course/ Medical Decision Making/ A&P                                 Medical Decision Making Amount and/or Complexity of Data Reviewed Labs: ordered.  Risk Prescription drug management.   This patient presents to the ED for concern of hypokalemia, this involves an extensive number of treatment options, and is a complaint that carries with it a high risk of complications and morbidity.  The differential diagnosis includes hypokalemia, hypomagnesemia, lab error   Co morbidities that complicate the patient evaluation  htn, depression, gerd, migraines, and tmj   Additional history obtained:  Additional history obtained from epic chart review  Lab Tests:  I Ordered, and personally interpreted labs.  The pertinent results include:  mg 1.8, k low at 2.9   Cardiac Monitoring:  The patient was maintained on a cardiac monitor.  I personally viewed and interpreted the cardiac monitored which showed an underlying rhythm of: nsr   Medicines ordered and prescription drug management:  I ordered medication including kcl, kdur, mg  for hypokalemia  Reevaluation of the patient after these medicines showed that the patient improved I have reviewed the patients home medicines and have made adjustments as needed  Problem List / ED Course:  Hypokalemia:  pt given 2 runs of IV KCl and 2 g of Mg.  She is not sure what bp meds she's on, but knows check and see if any of them have hydrochlorothiazide in them and stop that one.  Pt given a list of foods that are high in K.  She has oral K at home.  She is stable for d/c.  Return if  worse.  F/u with pcp.   Reevaluation:  After the interventions noted above, I reevaluated the patient and found that they have :improved   Social Determinants of Health:  Lives at home   Dispostion:  After consideration of the diagnostic results and the patients response to treatment, I feel that the patent would benefit from discharge with outpatient f/u.          Final Clinical Impression(s) / ED Diagnoses Final diagnoses:  Hypokalemia    Rx / DC Orders ED Discharge Orders     None         Jacalyn Lefevre, MD 03/04/24 2204

## 2024-03-04 NOTE — ED Triage Notes (Signed)
 Pt reports she had lab work drawn on Friday and was told today her potassium was low.

## 2024-04-28 ENCOUNTER — Other Ambulatory Visit: Payer: Self-pay

## 2024-04-28 ENCOUNTER — Emergency Department (HOSPITAL_COMMUNITY)
Admission: EM | Admit: 2024-04-28 | Discharge: 2024-04-28 | Disposition: A | Discharge status: Home / Self Care | Attending: Emergency Medicine | Admitting: Emergency Medicine

## 2024-04-28 DIAGNOSIS — E876 Hypokalemia: Secondary | ICD-10-CM | POA: Diagnosis not present

## 2024-04-28 DIAGNOSIS — R600 Localized edema: Secondary | ICD-10-CM | POA: Diagnosis not present

## 2024-04-28 DIAGNOSIS — R7989 Other specified abnormal findings of blood chemistry: Secondary | ICD-10-CM | POA: Diagnosis present

## 2024-04-28 LAB — CBC WITH DIFFERENTIAL/PLATELET
Abs Immature Granulocytes: 0.01 10*3/uL (ref 0.00–0.07)
Basophils Absolute: 0 10*3/uL (ref 0.0–0.1)
Basophils Relative: 0 %
Eosinophils Absolute: 0.1 10*3/uL (ref 0.0–0.5)
Eosinophils Relative: 2 %
HCT: 43.6 % (ref 36.0–46.0)
Hemoglobin: 14.8 g/dL (ref 12.0–15.0)
Immature Granulocytes: 0 %
Lymphocytes Relative: 36 %
Lymphs Abs: 2.7 10*3/uL (ref 0.7–4.0)
MCH: 30.2 pg (ref 26.0–34.0)
MCHC: 33.9 g/dL (ref 30.0–36.0)
MCV: 89 fL (ref 80.0–100.0)
Monocytes Absolute: 0.7 10*3/uL (ref 0.1–1.0)
Monocytes Relative: 10 %
Neutro Abs: 3.9 10*3/uL (ref 1.7–7.7)
Neutrophils Relative %: 52 %
Platelets: 284 10*3/uL (ref 150–400)
RBC: 4.9 MIL/uL (ref 3.87–5.11)
RDW: 13.2 % (ref 11.5–15.5)
WBC: 7.5 10*3/uL (ref 4.0–10.5)
nRBC: 0 % (ref 0.0–0.2)

## 2024-04-28 LAB — BASIC METABOLIC PANEL WITH GFR
Anion gap: 14 (ref 5–15)
BUN: 10 mg/dL (ref 6–20)
CO2: 30 mmol/L (ref 22–32)
Calcium: 9.4 mg/dL (ref 8.9–10.3)
Chloride: 92 mmol/L — ABNORMAL LOW (ref 98–111)
Creatinine, Ser: 0.73 mg/dL (ref 0.44–1.00)
GFR, Estimated: >60 mL/min (ref >60)
Glucose, Bld: 100 mg/dL — ABNORMAL HIGH (ref 70–99)
Potassium: 2.4 mmol/L — CL (ref 3.5–5.1)
Sodium: 136 mmol/L (ref 135–145)

## 2024-04-28 MED ORDER — POTASSIUM CHLORIDE 10 MEQ/100ML IV SOLN
10.0000 meq | Freq: Once | INTRAVENOUS | Status: AC
  Administered 2024-04-28: 10 meq via INTRAVENOUS
  Filled 2024-04-28: qty 100

## 2024-04-28 MED ORDER — POTASSIUM CHLORIDE CRYS ER 20 MEQ PO TBCR: 40.0000 meq | EXTENDED_RELEASE_TABLET | Freq: Once | ORAL | Status: DC

## 2024-04-28 MED ORDER — MAGNESIUM SULFATE 2 GM/50ML IV SOLN
2.0000 g | INTRAVENOUS | Status: AC
  Administered 2024-04-28: 2 g via INTRAVENOUS
  Filled 2024-04-28: qty 50

## 2024-04-28 NOTE — ED Triage Notes (Signed)
 Pt sent to er for abnormal potassium level by kidney dr at [facility], potassium in labs was 2.7

## 2024-04-28 NOTE — ED Notes (Signed)
 Pt patient her nephrologist would also like us  to check renin and aldosterone. Carolyn Allen

## 2024-04-28 NOTE — ED Provider Notes (Signed)
 [place] EMERGENCY DEPARTMENT AT [facility] Provider Note   CSN: [id number] Arrival date & time: 04/28/24  1954     History  Chief Complaint  Patient presents with   Abnormal Lab    Carolyn Allen is a 58 y.o. female.   Abnormal Lab This is a 64 female, she has a history of recurrent hypokalemia, she is on potassium supplementation and followed by nephrology at [facility].  She had lab work done this week and yesterday her last check showed that her potassium was 2.7.  She was asked to come into the hospital and asked for a renin and aldosterone test as well as have some potassium repleted.  She otherwise has no significant symptoms at this time.  She does report that she has some intermittent leg cramps, she also reports that she has some difficulty swallowing pills but notes that when she takes her medication she will often take a handful of pills at a time and swallow them all at once.  She does not recall any getting caught.  She has no difficulty swallowing food or fluids     Home Medications Prior to Admission medications   Medication Sig Start Date End Date Taking? Authorizing Provider  amLODipine (NORVASC) 10 MG tablet Take 10 mg by mouth daily. 10/11/23   [provider]  Ascorbic Acid (VITAMIN C PO) Take 1 tablet by mouth daily.    [provider]  Calcium Carbonate-Vitamin D (CALCIUM + D PO) Take 1 tablet by mouth daily.    [provider]  cyclobenzaprine  (FLEXERIL ) 10 MG tablet Take 1 tablet (10 mg total) by mouth 3 (three) times daily as needed. Patient taking differently: Take 20 mg by mouth 2 (two) times daily. 08/01/18   Marcina Severe, PA-C  dicyclomine  (BENTYL ) 20 MG tablet Take 1 tablet (20 mg total) by mouth every 6 (six) hours. Patient taking differently: Take 20 mg by mouth 3 (three) times daily before meals. 03/05/16   [name], MD  DULoxetine (CYMBALTA) 20 MG capsule Take 20 mg by mouth every morning.      [provider]  fluticasone (FLONASE) 50 MCG/ACT nasal spray Place 4 sprays into both nostrils at bedtime.    [provider]  HYDROcodone -acetaminophen  (NORCO/VICODIN) 5-325 MG tablet Take 1 tablet by mouth every 6 (six) hours as needed for moderate pain (pain score 4-6). 02/10/24   [provider]  lisinopril -hydrochlorothiazide  (ZESTORETIC ) 20-12.5 MG tablet Take 2 tablets by mouth daily. 03/11/22   [provider]  loratadine  (CLARITIN ) 10 MG tablet Take 10 mg by mouth daily as needed for allergies. 10/21/23   [provider]  losartan (COZAAR) 25 MG tablet Take 25 mg by mouth daily.    [provider]  meloxicam  (MOBIC ) 15 MG tablet Take 30 mg by mouth in the morning and at bedtime. 09/01/23   [provider]  Multiple Vitamins-Minerals (MULTIVITAMIN WITH MINERALS) tablet Take 1 tablet by mouth daily.    [provider]  omeprazole (PRILOSEC) 20 MG capsule Take 20 mg by mouth daily. 01/13/23   [provider]  PARoxetine  (PAXIL ) 10 MG tablet Take 10 mg by mouth every morning. Take with 20 mg to equal 30 mg    [provider]  PARoxetine  (PAXIL ) 20 MG tablet Take 20 mg by mouth See admin instructions. Take with 10 mg to equal 30 mg    [provider]  polyethylene glycol (MIRALAX  / GLYCOLAX ) 17 g packet Take  17 g by mouth daily as needed for mild constipation.    [provider]  potassium chloride  SA (KLOR-CON  M) 20 MEQ tablet Take 1 tablet (20 mEq total) by mouth 2 (two) times daily for 7 days. Patient taking differently: Take 40 mEq by mouth 2 (two) times daily. 11/08/23 03/04/24  [name], DO  promethazine  (PHENERGAN ) 25 MG tablet Take 25 mg by mouth every 8 (eight) hours as needed for nausea.    [provider]  QUEtiapine (SEROQUEL) 25 MG tablet Take 25 mg by mouth 2 (two) times daily.    [provider]  SUMAtriptan  (IMITREX ) 100 MG tablet Take 100 mg by mouth  every 2 (two) hours as needed for migraine or headache. 10/11/23   [provider]  traMADol  (ULTRAM -ER) 200 MG 24 hr tablet Take 200 mg by mouth daily.    [provider]      Allergies    Pholcodine, Codeine, Methocarbamol , Oxycodone , and Tizanidine    Review of Systems   Review of Systems  All other systems reviewed and are negative.   Physical Exam Updated Vital Signs BP (!) 155/96   Pulse 85   Temp 98 F (36.7 C) (Oral)   Resp 18   Ht 1.715 m (5' 7.5")   Wt 93 kg   SpO2 93%   BMI 31.63 kg/m  Physical Exam Vitals and nursing note reviewed.  Constitutional:      General: She is not in acute distress.    Appearance: She is well-developed.  HENT:     Head: Normocephalic and atraumatic.     Mouth/Throat:     Pharynx: No oropharyngeal exudate.  Eyes:     General: No scleral icterus.       Right eye: No discharge.        Left eye: No discharge.     Conjunctiva/sclera: Conjunctivae normal.     Pupils: Pupils are equal, round, and reactive to light.  Neck:     Thyroid: No thyromegaly.     Vascular: No JVD.  Cardiovascular:     Rate and Rhythm: Normal rate and regular rhythm.     Heart sounds: Normal heart sounds. No murmur heard.    No friction rub. No gallop.  Pulmonary:     Effort: Pulmonary effort is normal. No respiratory distress.     Breath sounds: Normal breath sounds. No wheezing or rales.  Abdominal:     General: Bowel sounds are normal. There is no distension.     Palpations: Abdomen is soft. There is no mass.     Tenderness: There is no abdominal tenderness.  Musculoskeletal:        General: No tenderness. Normal range of motion.     Cervical back: Normal range of motion and neck supple.     Right lower leg: Edema present.     Left lower leg: Edema present.  Lymphadenopathy:     Cervical: No cervical adenopathy.  Skin:    General: Skin is warm and dry.     Findings: No erythema or rash.  Neurological:     Mental Status: She is  alert.     Coordination: Coordination normal.  Psychiatric:        Behavior: Behavior normal.     ED Results / Procedures / Treatments   Labs (all labs ordered are listed, but only abnormal results are displayed) Labs Reviewed  BASIC METABOLIC PANEL WITH GFR - Abnormal; Notable for the following components:  Result Value   Potassium 2.4 (*)    Chloride 92 (*)    Glucose, Bld 100 (*)    All other components within normal limits  CBC WITH DIFFERENTIAL/PLATELET  ALDOSTERONE + RENIN ACTIVITY W/ RATIO  BASIC METABOLIC PANEL WITH GFR    EKG None  Radiology No results found.  Procedures Procedures    Medications Ordered in ED Medications  potassium chloride  SA (KLOR-CON  M) CR tablet 40 mEq (has no administration in time range)  magnesium  sulfate IVPB 2 g 50 mL (0 g Intravenous Stopped 04/28/24 2250)  potassium chloride  10 mEq in 100 mL IVPB (0 mEq Intravenous Stopped 04/28/24 2303)  potassium chloride  10 mEq in 100 mL IVPB (0 mEq Intravenous Stopped 04/28/24 2257)    ED Course/ Medical Decision Making/ A&P                                 Medical Decision Making Amount and/or Complexity of Data Reviewed Labs: ordered.  Risk Prescription drug management.    This patient presents to the ED for concern of hypokalemia differential diagnosis includes this could be a wasting syndrome, could be the kidneys wasting, added on the labs for the nephrologist.  The patient is in no distress has no arrhythmias has normal intervals, normal vital signs except for mild hypertension    Additional history obtained:  Additional history obtained from the record External records from outside source obtained and reviewed including stents of laboratory review, she is persistently chronically hypokalemic, today is not as low as she has been in the past   Lab Tests:  I Ordered, and personally interpreted labs.  The pertinent results include: K 2.4, labs otherwise  reassuring.    Medicines ordered and prescription drug management:  I ordered medication including magnesium  and potassium supplementation for hypokalemia Reevaluation of the patient after these medicines showed that the patient improved I have reviewed the patients home medicines and have made adjustments as needed   Problem List / ED Course:  Pt does not want to stay for recheck, or Oral K - she is HD stable and agreeable to f/u.   Social Determinants of Health:  Chronic low K           Final Clinical Impression(s) / ED Diagnoses Final diagnoses:  Hypokalemia    Rx / DC Orders ED Discharge Orders     None         Early Glisson, MD 04/28/24 2306

## 2024-04-28 NOTE — Discharge Instructions (Signed)
 Take your nighttime potassium when you get home, you will need to have your levels rechecked within the next 48 hours and have your family doctor follow-up on the blood work that you had requested on arrival which will not come back today

## 2024-05-04 LAB — ALDOSTERONE + RENIN ACTIVITY W/ RATIO
ALDO / PRA Ratio: 4 (ref 0.0–30.0)
Aldosterone: 8.8 ng/dL (ref 0.0–30.0)
PRA LC/MS/MS: 2.198 ng/mL/h (ref 0.167–5.380)

## 2024-05-14 ENCOUNTER — Emergency Department (HOSPITAL_COMMUNITY)
Admission: EM | Admit: 2024-05-14 | Discharge: 2024-05-14 | Disposition: A | Discharge status: Home / Self Care | Attending: Emergency Medicine | Admitting: Emergency Medicine

## 2024-05-14 ENCOUNTER — Other Ambulatory Visit: Payer: Self-pay

## 2024-05-14 ENCOUNTER — Encounter (HOSPITAL_COMMUNITY): Payer: Self-pay | Admitting: *Deleted

## 2024-05-14 DIAGNOSIS — E876 Hypokalemia: Secondary | ICD-10-CM | POA: Diagnosis not present

## 2024-05-14 DIAGNOSIS — R531 Weakness: Secondary | ICD-10-CM | POA: Diagnosis present

## 2024-05-14 LAB — CBC WITH DIFFERENTIAL/PLATELET
Abs Immature Granulocytes: 0.03 10*3/uL (ref 0.00–0.07)
Basophils Absolute: 0 10*3/uL (ref 0.0–0.1)
Basophils Relative: 0 %
Eosinophils Absolute: 0.1 10*3/uL (ref 0.0–0.5)
Eosinophils Relative: 2 %
HCT: 44 % (ref 36.0–46.0)
Hemoglobin: 14.8 g/dL (ref 12.0–15.0)
Immature Granulocytes: 0 %
Lymphocytes Relative: 30 %
Lymphs Abs: 2.5 10*3/uL (ref 0.7–4.0)
MCH: 30.5 pg (ref 26.0–34.0)
MCHC: 33.6 g/dL (ref 30.0–36.0)
MCV: 90.7 fL (ref 80.0–100.0)
Monocytes Absolute: 0.7 10*3/uL (ref 0.1–1.0)
Monocytes Relative: 8 %
Neutro Abs: 5 10*3/uL (ref 1.7–7.7)
Neutrophils Relative %: 60 %
Platelets: 244 10*3/uL (ref 150–400)
RBC: 4.85 MIL/uL (ref 3.87–5.11)
RDW: 13.3 % (ref 11.5–15.5)
WBC: 8.4 10*3/uL (ref 4.0–10.5)
nRBC: 0 % (ref 0.0–0.2)

## 2024-05-14 LAB — COMPREHENSIVE METABOLIC PANEL WITH GFR
ALT: 19 U/L (ref 0–44)
AST: 20 U/L (ref 15–41)
Albumin: 4.8 g/dL (ref 3.5–5.0)
Alkaline Phosphatase: 102 U/L (ref 38–126)
Anion gap: 13 (ref 5–15)
BUN: 11 mg/dL (ref 6–20)
CO2: 26 mmol/L (ref 22–32)
Calcium: 9.7 mg/dL (ref 8.9–10.3)
Chloride: 98 mmol/L (ref 98–111)
Creatinine, Ser: 0.84 mg/dL (ref 0.44–1.00)
GFR, Estimated: >60 mL/min (ref >60)
Glucose, Bld: 132 mg/dL — ABNORMAL HIGH (ref 70–99)
Potassium: 3.1 mmol/L — ABNORMAL LOW (ref 3.5–5.1)
Sodium: 137 mmol/L (ref 135–145)
Total Bilirubin: 0.7 mg/dL (ref 0.0–1.2)
Total Protein: 8.2 g/dL — ABNORMAL HIGH (ref 6.5–8.1)

## 2024-05-14 LAB — MAGNESIUM: Magnesium: 1.9 mg/dL (ref 1.7–2.4)

## 2024-05-14 MED ORDER — MAGNESIUM SULFATE 2 GM/50ML IV SOLN
2.0000 g | Freq: Once | INTRAVENOUS | Status: AC
  Administered 2024-05-14: 2 g via INTRAVENOUS
  Filled 2024-05-14: qty 50

## 2024-05-14 MED ORDER — POTASSIUM CHLORIDE 10 MEQ/100ML IV SOLN
10.0000 meq | INTRAVENOUS | Status: AC
  Administered 2024-05-14 (×3): 10 meq via INTRAVENOUS
  Filled 2024-05-14 (×3): qty 100

## 2024-05-14 NOTE — ED Notes (Signed)
 Pt reports low potassium level. No other complaints at this time.

## 2024-05-14 NOTE — ED Provider Notes (Signed)
[place] EMERGENCY DEPARTMENT AT [facility] Provider Note   CSN: [id number] Arrival date & time: 05/14/24  1319     History  Chief Complaint  Patient presents with   Abnormal Lab    Carolyn Allen is a 58 y.o. female.  HPI Presents with ongoing weakness.  She has had waxing, waning weakness for almost 1 year, has had multiple different medication adjustments, continues to have weakness, and hypokalemia.  Today she saw her physician, value was 2.9 and she was sent here. No focal chest pain, no syncope.    Home Medications Prior to Admission medications   Medication Sig Start Date End Date Taking? Authorizing Provider  amLODipine (NORVASC) 10 MG tablet Take 10 mg by mouth daily. 10/11/23   [provider]  Ascorbic Acid (VITAMIN C PO) Take 1 tablet by mouth daily.    [provider]  Calcium Carbonate-Vitamin D (CALCIUM + D PO) Take 1 tablet by mouth daily.    [provider]  cyclobenzaprine  (FLEXERIL ) 10 MG tablet Take 1 tablet (10 mg total) by mouth 3 (three) times daily as needed. Patient taking differently: Take 20 mg by mouth 2 (two) times daily. 08/01/18   Marcina Severe, PA-C  dicyclomine  (BENTYL ) 20 MG tablet Take 1 tablet (20 mg total) by mouth every 6 (six) hours. Patient taking differently: Take 20 mg by mouth 3 (three) times daily before meals. 03/05/16   [name], MD  DULoxetine (CYMBALTA) 20 MG capsule Take 20 mg by mouth every morning.     [provider]  fluticasone (FLONASE) 50 MCG/ACT nasal spray Place 4 sprays into both nostrils at bedtime.    [provider]  HYDROcodone -acetaminophen  (NORCO/VICODIN) 5-325 MG tablet Take 1 tablet by mouth every 6 (six) hours as needed for moderate pain (pain score 4-6). 02/10/24   [provider]  lisinopril -hydrochlorothiazide  (ZESTORETIC ) 20-12.5 MG tablet Take 2 tablets by mouth daily. 03/11/22   [provider]  loratadine  (CLARITIN ) 10  MG tablet Take 10 mg by mouth daily as needed for allergies. 10/21/23   [provider]  losartan (COZAAR) 25 MG tablet Take 25 mg by mouth daily.    [provider]  meloxicam  (MOBIC ) 15 MG tablet Take 30 mg by mouth in the morning and at bedtime. 09/01/23   [provider]  Multiple Vitamins-Minerals (MULTIVITAMIN WITH MINERALS) tablet Take 1 tablet by mouth daily.    [provider]  omeprazole (PRILOSEC) 20 MG capsule Take 20 mg by mouth daily. 01/13/23   [provider]  PARoxetine  (PAXIL ) 10 MG tablet Take 10 mg by mouth every morning. Take with 20 mg to equal 30 mg    [provider]  PARoxetine  (PAXIL ) 20 MG tablet Take 20 mg by mouth See admin instructions. Take with 10 mg to equal 30 mg    [provider]  polyethylene glycol (MIRALAX  / GLYCOLAX ) 17 g packet Take 17 g by mouth daily as needed for mild constipation.    [provider]  potassium chloride  SA (KLOR-CON  M) 20 MEQ tablet Take 1 tablet (20 mEq total) by mouth 2 (two) times daily for 7 days. Patient taking differently: Take 40 mEq by mouth 2 (two) times daily. 11/08/23 03/04/24  [name], DO  promethazine  (PHENERGAN ) 25 MG tablet Take 25 mg by mouth every 8 (eight) hours as needed for nausea.    [provider]  QUEtiapine (SEROQUEL) 25 MG tablet Take 25 mg by mouth  2 (two) times daily.    [provider]  SUMAtriptan  (IMITREX ) 100 MG tablet Take 100 mg by mouth every 2 (two) hours as needed for migraine or headache. 10/11/23   [provider]  traMADol  (ULTRAM -ER) 200 MG 24 hr tablet Take 200 mg by mouth daily.    [provider]      Allergies    Pholcodine, Codeine, Methocarbamol , Oxycodone , and Tizanidine    Review of Systems   Review of Systems  Physical Exam Updated Vital Signs BP 137/86   Pulse 68   Temp 98 F (36.7 C)   Resp 18   Ht 5' 7.5" (1.715 m)   Wt 93 kg   SpO2 97%   BMI 31.63 kg/m   Physical Exam Vitals and nursing note reviewed.  Constitutional:      General: She is not in acute distress.    Appearance: She is well-developed.  HENT:     Head: Normocephalic and atraumatic.  Eyes:     Conjunctiva/sclera: Conjunctivae normal.  Pulmonary:     Effort: Pulmonary effort is normal. No respiratory distress.     Breath sounds: No stridor.  Abdominal:     General: There is no distension.  Skin:    General: Skin is warm and dry.  Neurological:     General: No focal deficit present.     Mental Status: She is alert and oriented to person, place, and time.     Cranial Nerves: No cranial nerve deficit.  Psychiatric:        Mood and Affect: Mood normal.     ED Results / Procedures / Treatments   Labs (all labs ordered are listed, but only abnormal results are displayed) Labs Reviewed  COMPREHENSIVE METABOLIC PANEL WITH GFR - Abnormal; Notable for the following components:      Result Value   Potassium 3.1 (*)    Glucose, Bld 132 (*)    Total Protein 8.2 (*)    All other components within normal limits  CBC WITH DIFFERENTIAL/PLATELET  MAGNESIUM     EKG None  Radiology No results found.  Procedures Procedures    Medications Ordered in ED Medications  potassium chloride  10 mEq in 100 mL IVPB (10 mEq Intravenous New Bag/Given 05/14/24 2137)  magnesium  sulfate IVPB 2 g 50 mL (0 g Intravenous Stopped 05/14/24 2038)    ED Course/ Medical Decision Making/ A&P                                 Medical Decision Making Adult female presents with weakness, hypokalemia.  Differential including medication effects, electrolyte abnormalities, dehydration, less likely arrhythmia. Patient's lab values here notable for hypokalemia, 3.1.  She has already taken 60 mill equivalents oral, will receive IV potassium, magnesium  as she is hypomagnesemic as well.  Amount and/or Complexity of Data Reviewed External Data Reviewed: notes. Labs:  Decision-making details documented  in ED Course.  Risk Prescription drug management.   10:23 PM Patient in no distress, awake, alert, smiling.  She has nearly completed her potassium repletion, received magnesium , will follow-up with primary care for repeat check in the coming days.  No evidence for arrhythmia, or other complication.        Final Clinical Impression(s) / ED Diagnoses Final diagnoses:  Hypokalemia    Rx / DC Orders ED Discharge Orders     None         Dorenda Gandy, MD  05/14/24 2223  

## 2024-05-14 NOTE — ED Triage Notes (Signed)
 Pt here today with c/o low potassium level. Pt had her labs checked at her PCP and her potassium level was 2.9.

## 2024-05-14 NOTE — Discharge Instructions (Signed)
Return for concerning changes in your condition.
